# Patient Record
Sex: Male | Born: 1980 | Hispanic: Yes | Marital: Married | Smoking: Current every day smoker
Health system: Southern US, Community
[De-identification: ages and names within clinical notes are randomized; demographics above are authoritative.]

## PROBLEM LIST (undated history)

## (undated) DIAGNOSIS — N200 Calculus of kidney: Secondary | ICD-10-CM

## (undated) HISTORY — PX: FOOT SURGERY: SHX648

---
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
## 2010-08-11 ENCOUNTER — Emergency Department (HOSPITAL_COMMUNITY)
Admission: EM | Admit: 2010-08-11 | Discharge: 2010-08-12 | Disposition: A | Discharge status: Home / Self Care | Payer: Self-pay | Attending: Emergency Medicine | Admitting: Emergency Medicine

## 2010-08-11 ENCOUNTER — Emergency Department (HOSPITAL_COMMUNITY): Payer: Self-pay

## 2010-08-11 DIAGNOSIS — M25529 Pain in unspecified elbow: Secondary | ICD-10-CM | POA: Insufficient documentation

## 2010-08-11 DIAGNOSIS — S52123A Displaced fracture of head of unspecified radius, initial encounter for closed fracture: Secondary | ICD-10-CM | POA: Insufficient documentation

## 2010-08-11 DIAGNOSIS — S92213A Displaced fracture of cuboid bone of unspecified foot, initial encounter for closed fracture: Secondary | ICD-10-CM | POA: Insufficient documentation

## 2010-08-11 DIAGNOSIS — M25429 Effusion, unspecified elbow: Secondary | ICD-10-CM | POA: Insufficient documentation

## 2010-08-11 DIAGNOSIS — M2560 Stiffness of unspecified joint, not elsewhere classified: Secondary | ICD-10-CM | POA: Insufficient documentation

## 2010-08-11 DIAGNOSIS — S9030XA Contusion of unspecified foot, initial encounter: Secondary | ICD-10-CM | POA: Insufficient documentation

## 2010-08-11 DIAGNOSIS — S92253A Displaced fracture of navicular [scaphoid] of unspecified foot, initial encounter for closed fracture: Secondary | ICD-10-CM | POA: Insufficient documentation

## 2010-08-11 DIAGNOSIS — M79609 Pain in unspecified limb: Secondary | ICD-10-CM | POA: Insufficient documentation

## 2010-08-11 DIAGNOSIS — IMO0002 Reserved for concepts with insufficient information to code with codable children: Secondary | ICD-10-CM | POA: Insufficient documentation

## 2010-08-11 DIAGNOSIS — M25539 Pain in unspecified wrist: Secondary | ICD-10-CM | POA: Insufficient documentation

## 2010-08-14 ENCOUNTER — Ambulatory Visit (HOSPITAL_BASED_OUTPATIENT_CLINIC_OR_DEPARTMENT_OTHER)
Admission: RE | Admit: 2010-08-14 | Discharge: 2010-08-14 | Disposition: A | Discharge status: Home / Self Care | Payer: Self-pay | Source: Ambulatory Visit | Attending: Orthopedic Surgery | Admitting: Orthopedic Surgery

## 2010-08-14 DIAGNOSIS — S92253A Displaced fracture of navicular [scaphoid] of unspecified foot, initial encounter for closed fracture: Secondary | ICD-10-CM | POA: Insufficient documentation

## 2010-08-14 DIAGNOSIS — Y998 Other external cause status: Secondary | ICD-10-CM | POA: Insufficient documentation

## 2010-08-14 DIAGNOSIS — IMO0002 Reserved for concepts with insufficient information to code with codable children: Secondary | ICD-10-CM | POA: Insufficient documentation

## 2010-08-14 DIAGNOSIS — S92309A Fracture of unspecified metatarsal bone(s), unspecified foot, initial encounter for closed fracture: Secondary | ICD-10-CM | POA: Insufficient documentation

## 2010-08-14 LAB — POCT HEMOGLOBIN-HEMACUE: Hemoglobin: 14.4 g/dL (ref 13.0–17.0)
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
## 2013-10-19 ENCOUNTER — Emergency Department (HOSPITAL_COMMUNITY)
Admission: EM | Admit: 2013-10-19 | Discharge: 2013-10-19 | Disposition: A | Discharge status: Home / Self Care | Payer: Self-pay | Attending: Emergency Medicine | Admitting: Emergency Medicine

## 2013-10-19 ENCOUNTER — Encounter (HOSPITAL_COMMUNITY): Payer: Self-pay | Admitting: Emergency Medicine

## 2013-10-19 DIAGNOSIS — R1084 Generalized abdominal pain: Secondary | ICD-10-CM | POA: Insufficient documentation

## 2013-10-19 DIAGNOSIS — R6883 Chills (without fever): Secondary | ICD-10-CM | POA: Insufficient documentation

## 2013-10-19 DIAGNOSIS — F17200 Nicotine dependence, unspecified, uncomplicated: Secondary | ICD-10-CM | POA: Insufficient documentation

## 2013-10-19 DIAGNOSIS — R109 Unspecified abdominal pain: Secondary | ICD-10-CM | POA: Insufficient documentation

## 2013-10-19 DIAGNOSIS — R61 Generalized hyperhidrosis: Secondary | ICD-10-CM | POA: Insufficient documentation

## 2013-10-19 DIAGNOSIS — R748 Abnormal levels of other serum enzymes: Secondary | ICD-10-CM | POA: Insufficient documentation

## 2013-10-19 DIAGNOSIS — Z87442 Personal history of urinary calculi: Secondary | ICD-10-CM | POA: Insufficient documentation

## 2013-10-19 HISTORY — DX: Calculus of kidney: N20.0

## 2013-10-19 LAB — COMPREHENSIVE METABOLIC PANEL
ALBUMIN: 4.1 g/dL (ref 3.5–5.2)
ALT: 136 U/L — AB (ref 0–53)
AST: 201 U/L — ABNORMAL HIGH (ref 0–37)
Alkaline Phosphatase: 122 U/L — ABNORMAL HIGH (ref 39–117)
Anion gap: 11 (ref 5–15)
BILIRUBIN TOTAL: 0.3 mg/dL (ref 0.3–1.2)
BUN: 10 mg/dL (ref 6–23)
CO2: 30 meq/L (ref 19–32)
Calcium: 9.4 mg/dL (ref 8.4–10.5)
Chloride: 99 mEq/L (ref 96–112)
Creatinine, Ser: 0.91 mg/dL (ref 0.50–1.35)
GFR calc Af Amer: >90 mL/min (ref >90)
Glucose, Bld: 91 mg/dL (ref 70–99)
POTASSIUM: 4.2 meq/L (ref 3.7–5.3)
SODIUM: 140 meq/L (ref 137–147)
Total Protein: 7.5 g/dL (ref 6.0–8.3)

## 2013-10-19 LAB — URINALYSIS, ROUTINE W REFLEX MICROSCOPIC
BILIRUBIN URINE: NEGATIVE
Glucose, UA: NEGATIVE mg/dL
HGB URINE DIPSTICK: NEGATIVE
KETONES UR: NEGATIVE mg/dL
Leukocytes, UA: NEGATIVE
NITRITE: NEGATIVE
PH: 8 (ref 5.0–8.0)
Protein, ur: NEGATIVE mg/dL
SPECIFIC GRAVITY, URINE: 1.015 (ref 1.005–1.030)
Urobilinogen, UA: 1 mg/dL (ref 0.0–1.0)

## 2013-10-19 LAB — CBC WITH DIFFERENTIAL/PLATELET
Basophils Absolute: 0 10*3/uL (ref 0.0–0.1)
Basophils Relative: 0 % (ref 0–1)
EOS PCT: 2 % (ref 0–5)
Eosinophils Absolute: 0.2 10*3/uL (ref 0.0–0.7)
HCT: 42.2 % (ref 39.0–52.0)
Hemoglobin: 14.5 g/dL (ref 13.0–17.0)
LYMPHS ABS: 2.7 10*3/uL (ref 0.7–4.0)
Lymphocytes Relative: 23 % (ref 12–46)
MCH: 29.8 pg (ref 26.0–34.0)
MCHC: 34.4 g/dL (ref 30.0–36.0)
MCV: 86.7 fL (ref 78.0–100.0)
MONO ABS: 0.6 10*3/uL (ref 0.1–1.0)
Monocytes Relative: 5 % (ref 3–12)
Neutro Abs: 8.4 10*3/uL — ABNORMAL HIGH (ref 1.7–7.7)
Neutrophils Relative %: 70 % (ref 43–77)
Platelets: 178 10*3/uL (ref 150–400)
RBC: 4.87 MIL/uL (ref 4.22–5.81)
RDW: 12.3 % (ref 11.5–15.5)
WBC: 11.9 10*3/uL — ABNORMAL HIGH (ref 4.0–10.5)

## 2013-10-19 LAB — HEPATITIS PANEL, ACUTE
HCV AB: NEGATIVE
HEP A IGM: NONREACTIVE
HEP B C IGM: NONREACTIVE
Hepatitis B Surface Ag: NEGATIVE

## 2013-10-19 LAB — LIPASE, BLOOD: Lipase: 72 U/L — ABNORMAL HIGH (ref 11–59)

## 2013-10-19 MED ORDER — GI COCKTAIL ~~LOC~~: 30.0000 mL | Freq: Once | ORAL | Status: DC

## 2013-10-19 MED ORDER — KETOROLAC TROMETHAMINE 30 MG/ML IJ SOLN
30.0000 mg | Freq: Once | INTRAMUSCULAR | Status: AC
  Administered 2013-10-19: 30 mg via INTRAVENOUS
  Filled 2013-10-19: qty 1

## 2013-10-19 NOTE — ED Notes (Signed)
Per EMS, Patient from home, had a slightly greasy meal at dinner which he reports often causes him abd discomfort. Patient states he laid down after eating and began having upper abd pain described as cramping 10/10. Patient denies n/v/d, states he has never had this pain before. Patient denies bowl difficulties, no distension, no tenderness, no injury reported. Patient denies drug or etoh use.

## 2013-10-19 NOTE — ED Provider Notes (Signed)
Medical screening examination/treatment/procedure(s) were performed by non-physician practitioner and as supervising physician I was immediately available for consultation/collaboration.    Vida Roller, MD 10/19/13 [phone]

## 2013-10-19 NOTE — ED Notes (Signed)
Patient states he is here for abd pain, states his stomach "started shrinking on the inside" and that he began sweating. Patient states he attempted to make himself vomit but was unable to do so. Patient alert and oriented at this time. Denies alcohol or drug use.

## 2013-10-19 NOTE — Discharge Instructions (Signed)
Please follow the directions provided.  Your blood work for your liver were elevated and will need follow-up with a primary care provider.  Labs were ordered to check for hepatitis but they will not be back for several days.  Please avoid taking anything with Tylenol (acetaminophen) in it. Also avoid alcohol.  Please be sure to establish care with a primary care provider this week.   Abdominal (belly) pain can be caused by many things. Your caregiver performed an examination and possibly ordered blood/urine tests and imaging (CT scan, x-rays, ultrasound). Many cases can be observed and treated at home after initial evaluation in the emergency department. Even though you are being discharged home, abdominal pain can be unpredictable. Therefore, you need a repeated exam if your pain does not resolve, returns, or worsens. Most patients with abdominal pain don't have to be admitted to the hospital or have surgery, but serious problems like appendicitis and gallbladder attacks can start out as nonspecific pain. Many abdominal conditions cannot be diagnosed in one visit, so follow-up evaluations are very important.  SEEK IMMEDIATE MEDICAL ATTENTION IF: The pain does not go away or becomes severe.  A temperature above 101 develops.  Repeated vomiting occurs (multiple episodes).  The pain becomes localized to portions of the abdomen. The right side could possibly be appendicitis. In an adult, the left lower portion of the abdomen could be colitis or diverticulitis.  Blood is being passed in stools or vomit (bright red or black tarry stools).  Return also if you develop chest pain, difficulty breathing, dizziness or fainting, or become confused, poorly responsive,.  Emergency Department Resource Guide 1) Find a Doctor and Pay Out of Pocket Although you won't have to find out who is covered by your insurance plan, it is a good idea to ask around and get recommendations. You will then need to call the office and  see if the doctor you have chosen will accept you as a new patient and what types of options they offer for patients who are self-pay. Some doctors offer discounts or will set up payment plans for their patients who do not have insurance, but you will need to ask so you aren't surprised when you get to your appointment.  2) Contact Your Local Health Department Not all health departments have doctors that can see patients for sick visits, but many do, so it is worth a call to see if yours does. If you don't know where your local health department is, you can check in your phone book. The CDC also has a tool to help you locate your state's health department, and many state websites also have listings of all of their local health departments.  3) Find a Walk-in Clinic If your illness is not likely to be very severe or complicated, you may want to try a walk in clinic. These are popping up all over the country in pharmacies, drugstores, and shopping centers. They're usually staffed by nurse practitioners or physician assistants that have been trained to treat common illnesses and complaints. They're usually fairly quick and inexpensive. However, if you have serious medical issues or chronic medical problems, these are probably not your best option.  No Primary Care Doctor: - Call [facility] at  ([phone] - they can help you locate a primary care doctor that  accepts your insurance, provides certain services, etc. - Physician Referral Service- [phone]  Chronic Pain Problems: Organization         Address  Phone  [facility]  ([phone]) [phone] Patients need to be referred by their primary care doctor.  ° °Medication Assistance: °Organization         Address  Phone   Notes  °[place] [facility] [address] °[place], [place] [address] (336) 641-8030 --Must be a resident of [place] °-- Must have NO insurance coverage whatsoever (no  Medicaid/ Medicare, etc.) °-- The pt. MUST have a primary care doctor that directs their care regularly and follows them in the community °  °[facility]  (866) 331-1348   °[employer]  (888) 892-1162   ° °Agencies that provide inexpensive medical care: °Organization         Address  Phone   Notes  °[facility]  (336) 832-8035   °[facility]    (336) 832-7272   °[facility] [address] °[place], [place] [address] (336) 832-4777   °[facility] [address], °[place] (336) 271-4999   °[facility]    (336) 373-0678   °[facility]    (336) 272-1050   °[facility] ° [address], [place] Phone:  (336) 832-4444, Fax:  (336) 832-4440 Hours of Operation:  9 am - 6 pm, M-F.  Also accepts Medicaid/Medicare and self-pay.  ° [facility] ° [address], [place] Phone: (336) [phone], Fax: (336) [phone]. Hours of Operation:  8:30 am - 5:30 pm, M-F.  Also accepts Medicaid and self-pay.  °[facility] [place] [address], [place] Phone: (336) 878-6027   °[facility] [address], [place], [place] (336)723-1848, Ext. 123 Mondays & Thursdays: 7-9 AM.  First 15 patients are seen on a first come, first serve basis. °  ° °Medicaid-accepting [place] Providers: ° °Organization         Address  Phone   Notes  °[facility] [address], [place] (336) [phone] Also accepts self-pay patients.  °[facility] [address], [place] ° (336) [phone]   °[facility] [address], [place] (336) 288-8857   °[facility] [address] [place] [address], [place] (336) 299-7000   °Veita Bland [address], [place]  ° (336) [phone] Only accepts [place] Access Medicaid patients after they have their name applied to their card.   ° °Self-Pay (no insurance) in [place]: ° °Organization         Address  Phone   Notes  °Sickle Cell Patients, [facility] [address], [place] (336) 832-1970   °[facility] [address], [place] (336) 832-4400   °[facility] [place] ° [address], [place] (336) 992-4800   °[facility]/Dr. Osei-Bonsu ° [address] [place] [address], [place] or [address], [place] (336) [phone] Phone number for both [place] and [place] locations is the same.  °[facility] [address], [place] (336) 299-0000   °[facility] [address] [place] [address], [place] or [address] (336) 852-7530 °(336) 878-2260   °[facility] [address], [place] (336) 350-1642, phone; (336) 294-5005, fax Sees patients 1st and 3rd Saturday of every month.  Must not qualify for public or private insurance (i.e. Medicaid,   Medicare, [place] Health Choice, Veterans' Benefits) • Household income should be no more than 200% of the poverty level •The clinic cannot treat you if you are pregnant or think you are pregnant • Sexually transmitted diseases are not treated at the clinic.  ° ° °Dental Care: °Organization         Address  Phone  Notes  °[facility] [address], [place] (336) 641-6152 Accepts children up to age 21 who are enrolled in Medicaid or [place] Health Choice; pregnant women with a Medicaid card; and children who have applied for Medicaid or [place] Health Choice, but were declined, whose parents can pay a reduced fee at time of service.  °[facility] [place]  [address], [place] (336) 641-7733 Accepts children up to age 21 who are enrolled in Medicaid or [place] Health Choice; pregnant women with a Medicaid card; and children who have applied for Medicaid or [place] Health Choice,  but were declined, whose parents can pay a reduced fee at time of service.  °[facility] ° [address], [place] (336) 641-4533 Patients are seen by appointment only. Walk-ins are not accepted. [facility] will see patients 18 years of age and older. °Monday - Tuesday (8am-5pm) °Most Wednesdays (8:30-5pm) °$30 per visit, cash only  °[facility] ° [address], [place] (336) 641-4533 Patients are seen by appointment only. Walk-ins are not accepted. [facility] will see patients 18 years of age and older. °One Wednesday Evening (Monthly: Volunteer Based).  $30 per visit, cash only  °[facility]  (919) 537-3737 for adults; Children under age 4, call Graduate Pediatric Dentistry at ([phone]) [phone]. Children aged 4-14, please call ([phone]) [phone] to request a pediatric application. ° Dental services are provided in all areas of dental care including fillings, crowns and bridges, complete and partial dentures, implants, gum treatment, root canals, and extractions. Preventive care is also provided. Treatment is provided to both adults and children. °Patients are selected via a lottery and there is often a waiting list. °  °[facility] [address], °[place] ° (336) 763-8833 [email or web address] °  °[facility] [address], [place], [place] (336)723-1848, Ext. 123 Second and Fourth Thursday of each month, opens at 6:30 AM; Clinic ends at 9 AM.  Patients are seen on a first-come first-served basis, and a limited number are seen during each clinic.  ° °[facility] ° [address], [place], [place] (336) 723-7904   Eligibility Requirements °You must have lived in [place], [place], or [place] counties for at least the last three months. °  You cannot be eligible for state or federal sponsored healthcare insurance, including Veterans Administration, Medicaid, or Medicare. °  You generally  cannot be eligible for healthcare insurance through your employer.  °  How to apply: °Eligibility screenings are held every Tuesday and Wednesday afternoon from 1:00 pm until 4:00 pm. You do not need an appointment for the interview!  °[facility] [address], [place], [place] 336-631-2330   °[facility]  336-342-8273   °[facility]  336-703-3100   °[facility]  336-570-6415   ° °Behavioral Health Resources in the Community: °Intensive Outpatient Programs °Organization         Address  Phone  Notes  °[facility] [address], [place], [place] 336-878-6098   °[facility] [address], [place], [place] 336-832-9800   °[facility] [address], [place], [place] ° 336-882-2125   °[facility] [address],  °[place], [place] 1-800-853-5163 or 336-641-4981   °Substance Abuse Resources °Organization         Address  Phone  Notes  °[facility]  336-882-2125   °[facility]  336-784-9470   °[facility]  336-285-9073   °[facility]  336-845-3988   °Residential & Outpatient Substance Abuse Program  1-800-659-3381   °Psychological Services °Organization         Address  Phone  Notes  °[facility]  336- 832-9600   °[facility]  336- 378-7881   °[facility] [address], [place] 1-800-853-5163 or 336-641-4981   ° °Mobile Crisis Teams °Organization         Address  Phone  Notes  °[facility], Mobile Crisis Care Unit  1-877-626-1772   °Assertive °[facility] ° [address] [place], [place] 336-834-9664   °Sharon DeEsch [address] °[place] 336-554-5454   ° °Self-Help/Support Groups °Organization         Address  Phone             Notes  °[facility] - variety of support groups  336- 373-1402 Call for more  information  °Narcotics Anonymous (NA), [facility] [address], °[place]  2 meetings at this location  ° °Residential Treatment Programs °Organization         Address  Phone  Notes  °[facility] [address],    °[place]  1-866-801-8205   °[facility] ° [address], [place], [place] 704-293-8524   °[facility] [address], [place] 336-845-3988 Admissions: 8am-3pm M-F  °[facility] [address]    °[place], [place] 336-841-1104   °[facility] [address], [place], [place] 336-379-7146   °[facility] [address]  °[place], [place] 336-285-9073   °[facility] - Intensive Outpatient [address], [place], [place] 336-852-3033   °[facility] [address]  °[place], [place] 1-877-615-2722 or 336-784-9470   °[facility] ([facility] [address] [place], [place] 336-227-7417 Accepts Medicaid  °[facility] [address]  ° [place] 1-800-659-3381 Substance Abuse/Addiction Treatment  ° °[place] Behavioral Health Resources °Organization         Address  Phone  Notes  °[facility]  (888) 581-9988   °Julie Brannon, PhD [address] [place], [place]   (336) 349-5553 or (336) 951-0000   °[facility]   [address] °[place], [place] (336) 349-4454   °[facility] [address], [place], [place] (336) 342-8316 Insurance/Medicaid/sponsorship through [facility]  °[facility] [address]                                    [place], [place] (336) 342-8316 Therapy/tele-psych/case  °[facility] [address]  ° [place],  (336) 349-2233    °Dr. Arfeen  (336) 349-4544   °[facility] [place]  [employer] [place] [facility] 1) [address], [place] °2) [address], [place] °3)  [address], [place] (336)  349-3220 °(336) 342-7768 ° °(336) 342-8140   °[place] Child Abuse [facility] (336) 342-1394 or (336) 342-3537 (After Hours)    ° ° ° °

## 2013-10-19 NOTE — ED Provider Notes (Signed)
CSN: [id number]     Arrival date & time 10/19/13  0025 History   First MD Initiated Contact with Patient 10/19/13 0133     Chief Complaint  Patient presents with  . Abdominal Pain    upper   (Consider location/radiation/quality/duration/timing/severity/associated sxs/prior Treatment) HPI Chris Kelley is a 33 yo male PMH: gall stones and cholecystectomy, presenting to the ED with sudden onset abd pain this evening.  He states after he finished eating tonight, he felt like his abdomen "was shrinking" because it was cramping.  He states the pain made him short of breath and sweaty.  This resolved in the ambulance en route to the hospital.  His pain now is now resolved but it intermittently hurts.  He denies fever, chills, nausea, vomiting, diarrhea or constipation, dysuria or penile or testicular complaints.    Past Medical History  Diagnosis Date  . Kidney stones    History reviewed. No pertinent past surgical history. No family history on file. History  Substance Use Topics  . Smoking status: Current Every Day Smoker -- 0.50 packs/day    Types: Cigarettes  . Smokeless tobacco: Not on file  . Alcohol Use: No    Review of Systems  Constitutional: Positive for chills and diaphoresis. Negative for fever.  HENT: Negative for sore throat.   Eyes: Negative for visual disturbance.  Respiratory: Negative for cough and shortness of breath.   Cardiovascular: Negative for chest pain and leg swelling.  Gastrointestinal: Positive for abdominal pain. Negative for nausea, vomiting, diarrhea, constipation, blood in stool, abdominal distention and anal bleeding.  Genitourinary: Negative for dysuria.  Musculoskeletal: Negative for myalgias.  Skin: Negative for rash.  Neurological: Negative for weakness, numbness and headaches.    Allergies  Review of patient's allergies indicates no known allergies.  Home Medications   Prior to Admission medications   Medication Sig Start Date End Date  Taking? Authorizing Provider  acetaminophen (TYLENOL) 500 MG tablet Take 1,500 mg by mouth every 6 (six) hours as needed for mild pain.   Yes Historical Provider, MD  ibuprofen (ADVIL,MOTRIN) 200 MG tablet Take 1,000 mg by mouth every 6 (six) hours as needed for moderate pain.   Yes Historical Provider, MD   BP 147/92  Pulse 68  Temp(Src) 97.5 F (36.4 C) (Oral)  Resp 20  SpO2 99% Physical Exam  Nursing note and vitals reviewed. Constitutional: He is oriented to person, place, and time. He appears well-developed and well-nourished. No distress.  HENT:  Head: Normocephalic and atraumatic.  Mouth/Throat: Oropharynx is clear and moist. No oropharyngeal exudate.  Eyes: Conjunctivae are normal. No scleral icterus.  Neck: Neck supple. No thyromegaly present.  Cardiovascular: Normal rate, regular rhythm and intact distal pulses.   Pulmonary/Chest: Effort normal and breath sounds normal. No respiratory distress. He has no wheezes. He has no rales. He exhibits no tenderness.  Abdominal: Soft. Normal appearance and bowel sounds are normal. He exhibits no distension and no mass. There is no hepatosplenomegaly. There is tenderness. There is no rigidity, no rebound, no guarding, no CVA tenderness and no tenderness at McBurney's point.  Musculoskeletal: He exhibits no tenderness.  Lymphadenopathy:    He has no cervical adenopathy.  Neurological: He is alert and oriented to person, place, and time.  Skin: Skin is warm and dry. No rash noted. He is not diaphoretic.  Psychiatric: He has a normal mood and affect.    ED Course  Procedures (including critical care time) Labs Review Labs Reviewed  COMPREHENSIVE METABOLIC PANEL -  Abnormal; Notable for the following:    AST 201 (*)    ALT 136 (*)    Alkaline Phosphatase 122 (*)    All other components within normal limits  CBC WITH DIFFERENTIAL - Abnormal; Notable for the following:    WBC 11.9 (*)    Neutro Abs 8.4 (*)    All other components  within normal limits  LIPASE, BLOOD - Abnormal; Notable for the following:    Lipase 72 (*)    All other components within normal limits  URINALYSIS, ROUTINE W REFLEX MICROSCOPIC - Abnormal; Notable for the following:    APPearance CLOUDY (*)    All other components within normal limits  HEPATITIS PANEL, ACUTE   Imaging Review No results found.   EKG Interpretation None      MDM   Final diagnoses:  Abdominal pain, generalized  Elevated liver enzymes   Pt is well appearing, does not appear to be in any distress, he is sleeping during the exam but will awaken easily.  CBC, CMP, Lipase and UA collected. The AST, ALT, and Alk Phos were elevated to 201, 136, and 122 respectively.  He Denies excessive drinking and reports he only drinks one beer occasionally.  He only denies excessive acetaminophen use.  His abd tenderness on exam is vague and unable to reproduce but he is not tender to the RUQ.  He does not have any signs of peritonitis, or indication of  indication of appendicitis, bowel obstruction, bowel perforation, cholecystitis, (s/p cholecystectomy) or diverticulitis.  He was given IV toradol in the ED and his pain is improved.  Discharge instructions include resources to establish and follow-up with a primary care provider and instructions about the importance of following-up this week.  Return precautions include fever, chills, severe pain or intractable nausea and vomiting.   Pt and family are agreeable to the plan.   Filed Vitals:   10/19/13 0423  BP: 125/80  Pulse: 71  Temp: 97.8 F (36.6 C)  Resp: 16    Meds given in ED:  Medications  ketorolac (TORADOL) 30 MG/ML injection 30 mg (30 mg Intravenous Given 10/19/13 0307)    Discharge Medication List as of 10/19/2013  4:08 AM       Britt Bottom, NP 10/19/13 3403

## 2019-07-08 ENCOUNTER — Other Ambulatory Visit: Payer: Self-pay

## 2019-07-08 ENCOUNTER — Emergency Department (HOSPITAL_COMMUNITY): Payer: Self-pay

## 2019-07-08 ENCOUNTER — Emergency Department (HOSPITAL_COMMUNITY)
Admission: EM | Admit: 2019-07-08 | Discharge: 2019-07-08 | Disposition: A | Discharge status: Home / Self Care | Payer: Self-pay | Attending: Emergency Medicine | Admitting: Emergency Medicine

## 2019-07-08 ENCOUNTER — Encounter (HOSPITAL_COMMUNITY): Payer: Self-pay | Admitting: *Deleted

## 2019-07-08 DIAGNOSIS — S62314A Displaced fracture of base of fourth metacarpal bone, right hand, initial encounter for closed fracture: Secondary | ICD-10-CM | POA: Insufficient documentation

## 2019-07-08 DIAGNOSIS — Y929 Unspecified place or not applicable: Secondary | ICD-10-CM | POA: Insufficient documentation

## 2019-07-08 DIAGNOSIS — Y999 Unspecified external cause status: Secondary | ICD-10-CM | POA: Insufficient documentation

## 2019-07-08 DIAGNOSIS — W11XXXA Fall on and from ladder, initial encounter: Secondary | ICD-10-CM | POA: Insufficient documentation

## 2019-07-08 DIAGNOSIS — Y939 Activity, unspecified: Secondary | ICD-10-CM | POA: Insufficient documentation

## 2019-07-08 DIAGNOSIS — W19XXXA Unspecified fall, initial encounter: Secondary | ICD-10-CM

## 2019-07-08 DIAGNOSIS — F17210 Nicotine dependence, cigarettes, uncomplicated: Secondary | ICD-10-CM | POA: Insufficient documentation

## 2019-07-08 MED ORDER — NAPROXEN 250 MG PO TABS
500.0000 mg | ORAL_TABLET | Freq: Once | ORAL | Status: AC
  Administered 2019-07-08: 500 mg via ORAL
  Filled 2019-07-08: qty 2

## 2019-07-08 MED ORDER — NAPROXEN 500 MG PO TABS: 500.0000 mg | ORAL_TABLET | Freq: Two times a day (BID) | ORAL | 0 refills | Status: AC

## 2019-07-08 NOTE — Discharge Instructions (Signed)
Le he recetado medicina para Chief Technology Officer, tome una [address] veces al dia con comida.   Haga una cita con el cirguano de mano para evaluar su fractura de [place].

## 2019-07-08 NOTE — ED Notes (Signed)
Patient verbalizes understanding of discharge instructions. Opportunity for questioning and answers were provided. Armband removed by staff, pt discharged from ED and ambulated to lobby to leave with friend.

## 2019-07-08 NOTE — Progress Notes (Signed)
Orthopedic Tech Progress Note Patient Details:  Chris Kelley 11-May-1980 [id number]  Ortho Devices Type of Ortho Device: Volar splint, Finger splint Ortho Device/Splint Interventions: Application, Ordered   Post Interventions Patient Tolerated: Well   Gwendolyn Lima 07/08/2019, 4:28 PM

## 2019-07-08 NOTE — ED Provider Notes (Signed)
[facility] EMERGENCY DEPARTMENT Provider Note   CSN: [id number] Arrival date & time: 07/08/19  1342     History Chief Complaint  Patient presents with  . Fall    Chris Kelley is a 39 y.o. male.  39 y.o male with no PMH presents to the ED with a chief complaint of right hand pain s/p fall. Patient reports he was standing in the middle of a ladder when he fell bracing the fall with an outstretched hand. He ports pain along the dorsum aspect of his hand.  There is mild swelling noted to the dorsum aspect, swelling to the right index finger with limited range of motion.  Full range of motion of his right wrist.  He has not taken any medication for improvement in symptoms.  Denies any other injury.  The history is provided by the patient and medical records.  Fall Pertinent negatives include no chest pain, no abdominal pain, no headaches and no shortness of breath.       Past Medical History:  Diagnosis Date  . Kidney stones     There are no problems to display for this patient.   Past Surgical History:  Procedure Laterality Date  . FOOT SURGERY         No family history on file.  Social History   Tobacco Use  . Smoking status: Current Every Day Smoker    Packs/day: 0.50    Types: Cigarettes  . Smokeless tobacco: Never Used  Substance Use Topics  . Alcohol use: No  . Drug use: Never    Home Medications Prior to Admission medications   Medication Sig Start Date End Date Taking? Authorizing Provider  acetaminophen (TYLENOL) 500 MG tablet Take 1,500 mg by mouth every 6 (six) hours as needed for mild pain.    [provider]  ibuprofen (ADVIL,MOTRIN) 200 MG tablet Take 1,000 mg by mouth every 6 (six) hours as needed for moderate pain.    [provider]  naproxen (NAPROSYN) 500 MG tablet Take 1 tablet (500 mg total) by mouth 2 (two) times daily for 7 days. 07/08/19 07/15/19  Claude Manges, PA-C    Allergies    Patient has  no known allergies.  Review of Systems   Review of Systems  Constitutional: Negative for fever.  HENT: Negative for sore throat.   Respiratory: Negative for shortness of breath.   Cardiovascular: Negative for chest pain.  Gastrointestinal: Negative for abdominal pain, nausea and vomiting.  Genitourinary: Negative for flank pain.  Musculoskeletal: Positive for arthralgias and myalgias.  Neurological: Negative for light-headedness and headaches.  All other systems reviewed and are negative.   Physical Exam Updated Vital Signs BP (!) 142/87   Pulse 86   Temp 98.3 F (36.8 C) (Oral)   Resp 14   Ht 5\' 4"  (1.626 m)   Wt 63.5 kg   SpO2 98%   BMI 24.03 kg/m   Physical Exam Vitals and nursing note reviewed.  Constitutional:      Appearance: Normal appearance. He is not ill-appearing or toxic-appearing.  HENT:     Head: Normocephalic and atraumatic.     Comments: Visible lacerations, or bruising noted.    Nose: Nose normal.     Mouth/Throat:     Mouth: Mucous membranes are moist.  Eyes:     Pupils: Pupils are equal, round, and reactive to light.  Cardiovascular:     Rate and Rhythm: Normal rate.  Pulmonary:     Effort: Pulmonary  effort is normal.     Breath sounds: No wheezing or rales.  Abdominal:     General: Abdomen is flat.  Musculoskeletal:     Right wrist: No swelling, deformity, effusion, lacerations, tenderness, bony tenderness, snuff box tenderness or crepitus. Normal range of motion. Normal pulse.     Right hand: Swelling, deformity, tenderness and bony tenderness present. No lacerations. Decreased range of motion. Normal strength. Normal sensation. There is no disruption of two-point discrimination. Normal capillary refill. Normal pulse.     Cervical back: Normal range of motion and neck supple.     Comments: Pain with palpation of the dorsum aspect of the right hand along 4th metacarpal, swelling noted to the area with mild erythema.  Swelling also noted to the  right ring finger.  Limited range of motion of the right finger.  Capillary refill is intact, sensation preserved.  Skin:    General: Skin is warm and dry.  Neurological:     Mental Status: He is alert and oriented to person, place, and time.     ED Results / Procedures / Treatments   Labs (all labs ordered are listed, but only abnormal results are displayed) Labs Reviewed - No data to display  EKG None  Radiology DG Hand Complete Right  Result Date: 07/08/2019 CLINICAL DATA:  Patient fell down 3 stairs landing on the right hand. Complaining of pain. EXAM: RIGHT HAND - COMPLETE 3+ VIEW COMPARISON:  Wrist radiographs, 08/11/2010 FINDINGS: There is an oblique, non comminuted, nondisplaced fracture across the base of the third metacarpal. There is a probable additional fracture across the base of the fourth metacarpal, only evident on the oblique view. Minimally displaced dorsal avulsion fracture from the dorsal base of the distal phalanx of the ring finger, retracted proximally by 2 mm. Well corticated bone fragment lies dorsal to the carpal bones consistent with an old triquetral fracture. Mild soft tissue swelling that is most evident around the DIP joint of the fourth finger. IMPRESSION: 1. Oblique nondisplaced, non comminuted fracture across the base of the third metacarpal with a probable additional transverse fracture across the base of the fourth metacarpal. 2. Minimally displaced dorsal avulsion fracture from the dorsal base of the distal phalanx of the fourth finger. 3. No other fractures.  No dislocation Electronically Signed   By: Lajean Manes M.D.   On: 07/08/2019 15:01    Procedures Procedures (including critical care time)  Medications Ordered in ED Medications  naproxen (NAPROSYN) tablet 500 mg (500 mg Oral Given 07/08/19 1529)    ED Course  I have reviewed the triage vital signs and the nursing notes.  Pertinent labs & imaging results that were available during my care  of the patient were reviewed by me and considered in my medical decision making (see chart for details).    MDM Rules/Calculators/A&P  To the ED status fall from a ladder, reports about 5 feet, states he try to brace his fall with his right hand, does report pain to the dorsum aspect of his right hand along with right index finger, states he is able to flex his hand but with pain.  There is swelling noted to the hand aspect, full range of motion of the right wrist.  Has not taken any medication for pain control.  During evaluation pulses are present, sensation is intact, visible swelling to the dorsum aspect along the fourth metacarpal.  There is also swelling to the distal aspect of the fourth digit.  No pain with  palpation of the snuffbox, no pain with palpation of the wrist joint.   Xray of the right hand showed: 1. Oblique nondisplaced, non comminuted fracture across the base of  the third metacarpal with a probable additional transverse fracture  across the base of the fourth metacarpal.  2. Minimally displaced dorsal avulsion fracture from the dorsal base  of the distal phalanx of the fourth finger.  3. No other fractures. No dislocation     Consult placed to hand on call for further recommendations. Patient provided with naproxen for pain control.   3:48 PM Spoke to Dr. Orlan Leavens, who agrees with therapy and patient follow-up.  Personally placed patient on anti-inflammatories, he was provided with a copy of his x-ray along with a volar splint.  Return precautions were discussed at length, patient stable for discharge.   Portions of this note were generated with Scientist, clinical (histocompatibility and immunogenetics). Dictation errors may occur despite best attempts at proofreading.  Final Clinical Impression(s) / ED Diagnoses Final diagnoses:  Fall, initial encounter  Closed displaced fracture of base of fourth metacarpal bone of right hand, initial encounter    Rx / DC Orders ED Discharge Orders          Ordered    naproxen (NAPROSYN) 500 MG tablet  2 times daily     07/08/19 1602           Claude Manges, PA-C 07/08/19 1603    Alvira Monday, MD 07/09/19 1507

## 2019-07-08 NOTE — ED Triage Notes (Signed)
Patient states he fell down 3 stairs landing on his right hand. C/o pain in right hand , denies any other injuries. All information obtained through interpreter line.

## 2019-07-08 NOTE — ED Notes (Addendum)
Ortho Tech William called for arm and finger splint.

## 2020-02-07 ENCOUNTER — Emergency Department (HOSPITAL_COMMUNITY): Payer: Self-pay

## 2020-02-07 ENCOUNTER — Encounter (HOSPITAL_COMMUNITY): Payer: Self-pay

## 2020-02-07 ENCOUNTER — Emergency Department (HOSPITAL_COMMUNITY)
Admission: EM | Admit: 2020-02-07 | Discharge: 2020-02-08 | Disposition: A | Discharge status: Home / Self Care | Payer: Self-pay | Attending: Emergency Medicine | Admitting: Emergency Medicine

## 2020-02-07 ENCOUNTER — Other Ambulatory Visit: Payer: Self-pay

## 2020-02-07 ENCOUNTER — Ambulatory Visit (HOSPITAL_COMMUNITY)
Admission: EM | Admit: 2020-02-07 | Discharge: 2020-02-07 | Disposition: A | Discharge status: Home / Self Care | Payer: Self-pay | Attending: Family Medicine | Admitting: Family Medicine

## 2020-02-07 DIAGNOSIS — N50811 Right testicular pain: Secondary | ICD-10-CM | POA: Insufficient documentation

## 2020-02-07 DIAGNOSIS — N4400 Torsion of testis, unspecified: Secondary | ICD-10-CM

## 2020-02-07 DIAGNOSIS — Z5321 Procedure and treatment not carried out due to patient leaving prior to being seen by health care provider: Secondary | ICD-10-CM | POA: Insufficient documentation

## 2020-02-07 MED ORDER — OXYCODONE-ACETAMINOPHEN 5-325 MG PO TABS
1.0000 | ORAL_TABLET | ORAL | Status: DC | PRN
  Administered 2020-02-07: 1 via ORAL
  Filled 2020-02-07: qty 1

## 2020-02-07 NOTE — ED Triage Notes (Signed)
Pt reports that he was lifting something heavy yesterday and began to have sudden onset of pain and swelling in the R testicle, seen at [facility] and told to come here

## 2020-02-07 NOTE — ED Notes (Signed)
Pt states he had a fever hours ago before coming to UC.

## 2020-02-07 NOTE — ED Notes (Signed)
Patient is being discharged from the Urgent Care and sent to the Emergency Department via private vehicle . Per dr Jordan Likes , patient is in need of higher level of care due to testicular emergency. Patient is aware and verbalizes understanding of plan of care.  Vitals:   02/07/20 1908  BP: 134/85  Pulse: 99  Resp: 16  Temp: 98.6 F (37 C)  SpO2: 100%

## 2020-02-07 NOTE — Discharge Instructions (Signed)
Please be seen in the emergency department.  

## 2020-02-07 NOTE — ED Triage Notes (Signed)
Pt c/o of testicle pain x 2 days. Pt states the right side testicle is swollen and is experiencing lower back pain. Pt states he has not taken medication to relieve the pain. Pt states he has been able to urinate. Pt states he had kidney stones before. Pt states he had blood in urine 3 days ago. He states he is having right lower abdominal pain after lifting a heavy object.

## 2020-02-07 NOTE — ED Provider Notes (Signed)
[facility]    CSN: [id number] Arrival date & time: 02/07/20  1733      History   Chief Complaint Chief Complaint  Patient presents with  . Testicle Pain  . Back Pain    HPI Chris Kelley is a 39 y.o. male.   He is presenting with acutely painful right swollen testicle.  His symptoms are gotten worse over the past day.  He has pain with palpation as well as going to the bathroom.  No history of similar pain.  He did lift something heavy.  HPI  Past Medical History:  Diagnosis Date  . Kidney stones     There are no problems to display for this patient.   Past Surgical History:  Procedure Laterality Date  . FOOT SURGERY         Home Medications    Prior to Admission medications   Medication Sig Start Date End Date Taking? Authorizing Provider  acetaminophen (TYLENOL) 500 MG tablet Take 1,500 mg by mouth every 6 (six) hours as needed for mild pain.    [provider]  ibuprofen (ADVIL,MOTRIN) 200 MG tablet Take 1,000 mg by mouth every 6 (six) hours as needed for moderate pain.    [provider]    Family History History reviewed. No pertinent family history.  Social History Social History   Tobacco Use  . Smoking status: Current Every Day Smoker    Packs/day: 0.50    Types: Cigarettes  . Smokeless tobacco: Never Used  Substance Use Topics  . Alcohol use: No  . Drug use: Never     Allergies   Patient has no known allergies.   Review of Systems Review of Systems  See HPI  Physical Exam Triage Vital Signs ED Triage Vitals  Enc Vitals Group     BP 02/07/20 1908 134/85     Pulse Rate 02/07/20 1908 99     Resp 02/07/20 1908 16     Temp 02/07/20 1908 98.6 F (37 C)     Temp Source 02/07/20 1908 Oral     SpO2 02/07/20 1908 100 %     Weight --      Height --      Head Circumference --      Peak Flow --      Pain Score 02/07/20 1905 10     Pain Loc --      Pain Edu? --      Excl. in GC? --    No data  found.  Updated Vital Signs BP 134/85 (BP Location: Right Arm)   Pulse 99   Temp 98.6 F (37 C) (Oral)   Resp 16   SpO2 100%   Visual Acuity Right Eye Distance:   Left Eye Distance:   Bilateral Distance:    Right Eye Near:   Left Eye Near:    Bilateral Near:     Physical Exam Gen: NAD, alert, cooperative with exam,  ENT: normal lips, normal nasal mucosa,  Eye: normal EOM, normal conjunctiva and lids GU: Significantly swollen and red and tender right testicle Skin: no rashes, no areas of induration  Neuro: normal tone, normal sensation to touch Psych:  normal insight, alert and oriented    UC Treatments / Results  Labs (all labs ordered are listed, but only abnormal results are displayed) Labs Reviewed - No data to display  EKG   Radiology No results found.  Procedures Procedures (including critical care time)  Medications Ordered in UC  Medications - No data to display  Initial Impression / Assessment and Plan / UC Course  I have reviewed the triage vital signs and the nursing notes.  Pertinent labs & imaging results that were available during my care of the patient were reviewed by me and considered in my medical decision making (see chart for details).     Mr. Chris Kelley is a 39 year old male that is presenting with a right painful swollen testicle.  Symptoms been ongoing for about a day.  They have progressively gotten worse.  Significant tenderness on exam.  Concern for testicular torsion.  Advised to be seen in the emergency department.  Final Clinical Impressions(s) / UC Diagnoses   Final diagnoses:  Testicular torsion     Discharge Instructions     Please be seen in the emergency department.     ED Prescriptions    None     PDMP not reviewed this encounter.   Myra Rude, MD 02/07/20 [phone]

## 2020-02-08 ENCOUNTER — Emergency Department (HOSPITAL_COMMUNITY)
Admission: EM | Admit: 2020-02-08 | Discharge: 2020-02-09 | Disposition: A | Discharge status: Home / Self Care | Payer: Self-pay | Attending: Emergency Medicine | Admitting: Emergency Medicine

## 2020-02-08 ENCOUNTER — Emergency Department (HOSPITAL_COMMUNITY): Payer: Self-pay

## 2020-02-08 DIAGNOSIS — N451 Epididymitis: Secondary | ICD-10-CM | POA: Insufficient documentation

## 2020-02-08 DIAGNOSIS — N4400 Torsion of testis, unspecified: Secondary | ICD-10-CM

## 2020-02-08 DIAGNOSIS — F17210 Nicotine dependence, cigarettes, uncomplicated: Secondary | ICD-10-CM | POA: Insufficient documentation

## 2020-02-08 LAB — CBC WITH DIFFERENTIAL/PLATELET
Abs Immature Granulocytes: 0.08 10*3/uL — ABNORMAL HIGH (ref 0.00–0.07)
Basophils Absolute: 0.1 10*3/uL (ref 0.0–0.1)
Basophils Relative: 0 %
Eosinophils Absolute: 0.1 10*3/uL (ref 0.0–0.5)
Eosinophils Relative: 1 %
HCT: 43.2 % (ref 39.0–52.0)
Hemoglobin: 13.8 g/dL (ref 13.0–17.0)
Immature Granulocytes: 1 %
Lymphocytes Relative: 14 %
Lymphs Abs: 2.2 10*3/uL (ref 0.7–4.0)
MCH: 28 pg (ref 26.0–34.0)
MCHC: 31.9 g/dL (ref 30.0–36.0)
MCV: 87.6 fL (ref 80.0–100.0)
Monocytes Absolute: 1.1 10*3/uL — ABNORMAL HIGH (ref 0.1–1.0)
Monocytes Relative: 7 %
Neutro Abs: 12.1 10*3/uL — ABNORMAL HIGH (ref 1.7–7.7)
Neutrophils Relative %: 77 %
Platelets: 260 10*3/uL (ref 150–400)
RBC: 4.93 MIL/uL (ref 4.22–5.81)
RDW: 11.9 % (ref 11.5–15.5)
WBC: 15.6 10*3/uL — ABNORMAL HIGH (ref 4.0–10.5)
nRBC: 0 % (ref 0.0–0.2)

## 2020-02-08 MED ORDER — OXYCODONE-ACETAMINOPHEN 5-325 MG PO TABS
1.0000 | ORAL_TABLET | ORAL | Status: DC | PRN
  Administered 2020-02-08: 1 via ORAL
  Filled 2020-02-08: qty 1

## 2020-02-08 NOTE — ED Notes (Signed)
NA X4.  

## 2020-02-08 NOTE — ED Triage Notes (Signed)
Pt here for R sided testicle pain onset yesterday. Seen at [facility] and dx with testicular torsion. Pt sent here yesterday for eval and lwbs. Pt states pain is increasing.

## 2020-02-09 LAB — URINALYSIS, ROUTINE W REFLEX MICROSCOPIC
Bilirubin Urine: NEGATIVE
Glucose, UA: NEGATIVE mg/dL
Hgb urine dipstick: NEGATIVE
Ketones, ur: NEGATIVE mg/dL
Nitrite: NEGATIVE
Protein, ur: 30 mg/dL — AB
Specific Gravity, Urine: 1.021 (ref 1.005–1.030)
WBC, UA: >50 WBC/hpf — ABNORMAL HIGH (ref 0–5)
pH: 7 (ref 5.0–8.0)

## 2020-02-09 MED ORDER — HYDROCODONE-ACETAMINOPHEN 5-325 MG PO TABS: 1.0000 | ORAL_TABLET | Freq: Four times a day (QID) | ORAL | 0 refills | Status: AC | PRN

## 2020-02-09 MED ORDER — DOXYCYCLINE HYCLATE 100 MG PO CAPS: 100.0000 mg | ORAL_CAPSULE | Freq: Two times a day (BID) | ORAL | 0 refills | Status: DC

## 2020-02-09 MED ORDER — STERILE WATER FOR INJECTION IJ SOLN
INTRAMUSCULAR | Status: AC
  Administered 2020-02-09: 1 mL
  Filled 2020-02-09: qty 10

## 2020-02-09 MED ORDER — DOXYCYCLINE HYCLATE 100 MG PO TABS
100.0000 mg | ORAL_TABLET | Freq: Once | ORAL | Status: AC
  Administered 2020-02-09: 100 mg via ORAL
  Filled 2020-02-09: qty 1

## 2020-02-09 MED ORDER — KETOROLAC TROMETHAMINE 30 MG/ML IJ SOLN
30.0000 mg | Freq: Once | INTRAMUSCULAR | Status: AC
  Administered 2020-02-09: 30 mg via INTRAMUSCULAR
  Filled 2020-02-09: qty 1

## 2020-02-09 MED ORDER — CEFTRIAXONE SODIUM 500 MG IJ SOLR
500.0000 mg | Freq: Once | INTRAMUSCULAR | Status: AC
  Administered 2020-02-09: 500 mg via INTRAMUSCULAR
  Filled 2020-02-09: qty 500

## 2020-02-09 MED ORDER — IBUPROFEN 800 MG PO TABS: 800.0000 mg | ORAL_TABLET | Freq: Three times a day (TID) | ORAL | 0 refills | Status: AC | PRN

## 2020-02-09 NOTE — ED Provider Notes (Signed)
[facility] EMERGENCY DEPARTMENT Provider Note  CSN: [id number] Arrival date & time: 02/08/20 1321    History Chief Complaint  Patient presents with   Testicle Pain    HPI  Chris Kelley is a 39 y.o. male with no significant PMH reports 2-3 days ago he was having trouble urinating, felt like he could not get his urine to come out and that it was burning. He strained and eventually passed a small clot and then his urine began flowing. He interpreted this as a kidney stone but no formal diagnosis of kidney stones. After this happened he began to have increased pain and swelling to his R testicle. He was seen at [facility] and subsequently sent to the ED for evaluation of testicular torsion. He had an [place] while waiting to be seen that showed epididymitis and hydrocele, but no torsion. He left without being seen last night but returned today due to continued pain. Denies fever. He is sexually active. Unsure of STI exposure.    Past Medical History:  Diagnosis Date   Kidney stones     Past Surgical History:  Procedure Laterality Date   FOOT SURGERY      No family history on file.  Social History   Tobacco Use   Smoking status: Current Every Day Smoker    Packs/day: 0.50    Types: Cigarettes   Smokeless tobacco: Never Used  Substance Use Topics   Alcohol use: No   Drug use: Never     Home Medications Prior to Admission medications   Medication Sig Start Date End Date Taking? Authorizing Provider  doxycycline (VIBRAMYCIN) 100 MG capsule Take 1 capsule (100 mg total) by mouth 2 (two) times daily. 02/09/20   Pollyann Savoy, MD  HYDROcodone-acetaminophen (NORCO/VICODIN) 5-325 MG tablet Take 1 tablet by mouth every 6 (six) hours as needed for severe pain. 02/09/20   Pollyann Savoy, MD  ibuprofen (ADVIL) 800 MG tablet Take 1 tablet (800 mg total) by mouth every 8 (eight) hours as needed. 02/09/20   Pollyann Savoy, MD     Allergies    Patient has no known  allergies.   Review of Systems   Review of Systems A comprehensive review of systems was completed and negative except as noted in HPI.    Physical Exam BP 130/84    Pulse 96    Temp 98.4 F (36.9 C) (Oral)    Resp (!) 24    Ht 5\' 4"  (1.626 m)    Wt 61.2 kg    SpO2 96%    BMI 23.17 kg/m   Physical Exam Vitals and nursing note reviewed.  Constitutional:      Appearance: Normal appearance.  HENT:     Head: Normocephalic and atraumatic.     Nose: Nose normal.     Mouth/Throat:     Mouth: Mucous membranes are moist.  Eyes:     Extraocular Movements: Extraocular movements intact.     Conjunctiva/sclera: Conjunctivae normal.  Cardiovascular:     Rate and Rhythm: Normal rate.  Pulmonary:     Effort: Pulmonary effort is normal.     Breath sounds: Normal breath sounds.  Abdominal:     General: Abdomen is flat.     Palpations: Abdomen is soft.     Tenderness: There is no abdominal tenderness.  Genitourinary:    Penis: Normal.      Comments: No urethral discharge, no penile sores. R hemiscrotum is markedly swollen, erythematous and tender Musculoskeletal:  General: No swelling. Normal range of motion.     Cervical back: Neck supple.  Skin:    General: Skin is warm and dry.  Neurological:     General: No focal deficit present.     Mental Status: He is alert.  Psychiatric:        Mood and Affect: Mood normal.      ED Results / Procedures / Treatments   Labs (all labs ordered are listed, but only abnormal results are displayed) Labs Reviewed  CBC WITH DIFFERENTIAL/PLATELET - Abnormal; Notable for the following components:      Result Value   WBC 15.6 (*)    Neutro Abs 12.1 (*)    Monocytes Absolute 1.1 (*)    Abs Immature Granulocytes 0.08 (*)    All other components within normal limits  URINALYSIS, ROUTINE W REFLEX MICROSCOPIC - Abnormal; Notable for the following components:   APPearance CLOUDY (*)    Protein, ur 30 (*)    Leukocytes,Ua LARGE (*)    WBC,  UA >50 (*)    Bacteria, UA RARE (*)    All other components within normal limits  URINE CULTURE  GC/CHLAMYDIA PROBE AMP (Media) NOT AT [facility]    EKG None  Radiology US SCROTUM W/DOPPLER  Result Date: 02/08/2020 CLINICAL DATA:  Right-sided testicular pain for 1 day EXAM: SCROTAL ULTRASOUND DOPPLER ULTRASOUND OF THE TESTICLES TECHNIQUE: Complete ultrasound examination of the testicles, epididymis, and other scrotal structures was performed. Color and spectral Doppler ultrasound were also utilized to evaluate blood flow to the testicles. COMPARISON:  None. FINDINGS: Right testicle Measurements: 5.3 x 3.1 x 4.0 cm. No mass or microlithiasis visualized. Left testicle Measurements: 4.9 x 3.3 x 3.0 cm. No mass or microlithiasis visualized. Right epididymis: Increased vascularity is noted within the right epididymis likely related to epididymitis. Left epididymis:  Normal in size and appearance. Hydrocele: Small right hydrocele is noted with complicated septations. Varicocele:  None visualized. Pulsed Doppler interrogation of both testes demonstrates normal low resistance arterial and venous waveforms bilaterally. IMPRESSION: Increased vascularity within the right epididymis likely related to epididymitis. Mildly complicated right hydrocele. Normal-appearing testicles bilaterally. Electronically Signed   By: Alcide Clever M.D.   On: 02/08/2020 15:18   US SCROTUM W/DOPPLER  Result Date: 02/07/2020 CLINICAL DATA:  Right testicular pain, suspected torsion. EXAM: SCROTAL ULTRASOUND DOPPLER ULTRASOUND OF THE TESTICLES TECHNIQUE: Complete ultrasound examination of the testicles, epididymis, and other scrotal structures was performed. Color and spectral Doppler ultrasound were also utilized to evaluate blood flow to the testicles. COMPARISON:  None. FINDINGS: Right testicle Measurements: 5.2 x 3 x 3.9 cm. No mass or microlithiasis visualized. Left testicle Measurements: 5.3 x 2.8 x 3.5 Cm. No mass or  microlithiasis visualized. Right epididymis:  Enlarged and hypervascular. Left epididymis:  Normal in size and appearance. Hydrocele: Large multi septated right hydrocele. Small simple left hydrocele. Varicocele:  None visualized. Pulsed Doppler interrogation of both testes demonstrates normal low resistance arterial and venous waveforms bilaterally. No increased vascularity to the right testis. Per ultrasound tech on physical exam the right knee and right testicle is red and swollen. IMPRESSION: 1. Right epididymitis with associated complex large right hydrocele which could represent a pyocele. 2. No findings to suggest testicular torsion; however, please note torsion could be intermittent and not visualized. 3. Remainder of the scrotal ultrasound is unremarkable. These results were called by telephone at the time of interpretation on 02/07/2020 at 10:35 pm to provider DAN FLOYD , who verbally acknowledged these  results. Electronically Signed   By: Tish Frederickson M.D.   On: 02/07/2020 22:36    Procedures Procedures  Medications Ordered in the ED Medications  oxyCODONE-acetaminophen (PERCOCET/ROXICET) 5-325 MG per tablet 1 tablet (1 tablet Oral Given 02/08/20 2118)  cefTRIAXone (ROCEPHIN) injection 500 mg (has no administration in time range)  doxycycline (VIBRA-TABS) tablet 100 mg (has no administration in time range)  ketorolac (TORADOL) 30 MG/ML injection 30 mg (has no administration in time range)  sterile water (preservative free) injection (has no administration in time range)     MDM Rules/Calculators/A&P MDM [place] today re-demonstrates epididymitis and hydrocele. No signs of torsion. Given concern for possible STI, will treat with Rocephin, Doxycycline. UA and GC/CT ordered. Plan discharge with Urology follow up.   ED Course  I have reviewed the triage vital signs and the nursing notes.  Pertinent labs & imaging results that were available during my care of the patient were reviewed by  me and considered in my medical decision making (see chart for details).  Clinical Course as of 02/09/20 0125  Sat Feb 09, 2020  0024 CBC shows leukocytosis. UA and GC/CT not yet collected.  [CS]  0122 UA with signs of infection, consistent with epididymitis. Plan discharge as above, Urine culture added. GC/CT still pending.   [CS]    Clinical Course User Index [CS] Pollyann Savoy, MD    Final Clinical Impression(s) / ED Diagnoses Final diagnoses:  Epididymitis    Rx / DC Orders ED Discharge Orders         Ordered    doxycycline (VIBRAMYCIN) 100 MG capsule  2 times daily        02/09/20 0124    ibuprofen (ADVIL) 800 MG tablet  Every 8 hours PRN        02/09/20 0124    HYDROcodone-acetaminophen (NORCO/VICODIN) 5-325 MG tablet  Every 6 hours PRN        02/09/20 0124           Pollyann Savoy, MD 02/09/20 [phone]

## 2020-02-10 LAB — URINE CULTURE

## 2021-09-26 ENCOUNTER — Other Ambulatory Visit: Payer: Self-pay

## 2021-09-26 ENCOUNTER — Encounter (HOSPITAL_COMMUNITY): Payer: Self-pay | Admitting: Emergency Medicine

## 2021-09-26 ENCOUNTER — Emergency Department (HOSPITAL_COMMUNITY)
Admission: EM | Admit: 2021-09-26 | Discharge: 2021-09-27 | Disposition: A | Discharge status: Home Health | Payer: Self-pay | Attending: Emergency Medicine | Admitting: Emergency Medicine

## 2021-09-26 DIAGNOSIS — Z87442 Personal history of urinary calculi: Secondary | ICD-10-CM | POA: Insufficient documentation

## 2021-09-26 DIAGNOSIS — L089 Local infection of the skin and subcutaneous tissue, unspecified: Secondary | ICD-10-CM

## 2021-09-26 DIAGNOSIS — D72829 Elevated white blood cell count, unspecified: Secondary | ICD-10-CM | POA: Insufficient documentation

## 2021-09-26 DIAGNOSIS — L03116 Cellulitis of left lower limb: Secondary | ICD-10-CM

## 2021-09-26 LAB — CBC WITH DIFFERENTIAL/PLATELET
Abs Immature Granulocytes: 0.07 10*3/uL (ref 0.00–0.07)
Basophils Absolute: 0 10*3/uL (ref 0.0–0.1)
Basophils Relative: 0 %
Eosinophils Absolute: 0.1 10*3/uL (ref 0.0–0.5)
Eosinophils Relative: 1 %
HCT: 40.6 % (ref 39.0–52.0)
Hemoglobin: 13.5 g/dL (ref 13.0–17.0)
Immature Granulocytes: 1 %
Lymphocytes Relative: 22 %
Lymphs Abs: 2.5 10*3/uL (ref 0.7–4.0)
MCH: 29.3 pg (ref 26.0–34.0)
MCHC: 33.3 g/dL (ref 30.0–36.0)
MCV: 88.1 fL (ref 80.0–100.0)
Monocytes Absolute: 0.6 10*3/uL (ref 0.1–1.0)
Monocytes Relative: 5 %
Neutro Abs: 8.1 10*3/uL — ABNORMAL HIGH (ref 1.7–7.7)
Neutrophils Relative %: 71 %
Platelets: 237 10*3/uL (ref 150–400)
RBC: 4.61 MIL/uL (ref 4.22–5.81)
RDW: 12.3 % (ref 11.5–15.5)
WBC: 11.4 10*3/uL — ABNORMAL HIGH (ref 4.0–10.5)
nRBC: 0 % (ref 0.0–0.2)

## 2021-09-26 LAB — COMPREHENSIVE METABOLIC PANEL
ALT: 59 U/L — ABNORMAL HIGH (ref 0–44)
AST: 31 U/L (ref 15–41)
Albumin: 3.5 g/dL (ref 3.5–5.0)
Alkaline Phosphatase: 114 U/L (ref 38–126)
Anion gap: 8 (ref 5–15)
BUN: 13 mg/dL (ref 6–20)
CO2: 27 mmol/L (ref 22–32)
Calcium: 9.1 mg/dL (ref 8.9–10.3)
Chloride: 103 mmol/L (ref 98–111)
Creatinine, Ser: 0.93 mg/dL (ref 0.61–1.24)
GFR, Estimated: >60 mL/min (ref >60)
Glucose, Bld: 138 mg/dL — ABNORMAL HIGH (ref 70–99)
Potassium: 4 mmol/L (ref 3.5–5.1)
Sodium: 138 mmol/L (ref 135–145)
Total Bilirubin: 0.6 mg/dL (ref 0.3–1.2)
Total Protein: 6.9 g/dL (ref 6.5–8.1)

## 2021-09-26 LAB — LACTIC ACID, PLASMA: Lactic Acid, Venous: 2.6 mmol/L (ref 0.5–1.9)

## 2021-09-26 NOTE — ED Triage Notes (Signed)
Pt reports having what he initially thought was poison ivy and then he developed abscesses to bilateral shins.  Pt has erythematous and hot to touch swelling to both lower legs, left worse than right. Fever and chills at home.

## 2021-09-27 ENCOUNTER — Encounter (HOSPITAL_COMMUNITY): Payer: Self-pay | Admitting: Emergency Medicine

## 2021-09-27 LAB — LACTIC ACID, PLASMA: Lactic Acid, Venous: 2 mmol/L (ref 0.5–1.9)

## 2021-09-27 MED ORDER — SULFAMETHOXAZOLE-TRIMETHOPRIM 800-160 MG PO TABS
1.0000 | ORAL_TABLET | Freq: Once | ORAL | Status: AC
  Administered 2021-09-27: 1 via ORAL
  Filled 2021-09-27: qty 1

## 2021-09-27 MED ORDER — SULFAMETHOXAZOLE-TRIMETHOPRIM 800-160 MG PO TABS: 1.0000 | ORAL_TABLET | Freq: Two times a day (BID) | ORAL | 0 refills | Status: AC

## 2021-09-27 NOTE — ED Provider Notes (Signed)
[facility] EMERGENCY DEPARTMENT Provider Note  CSN: [id number] Arrival date & time: 09/26/21 2150  Chief Complaint(s) Wound Infection  HPI Chris Kelley is a 41 y.o. male  here for leg infection.  Patient reports having had poison ivy rash on his lower extremities.  5 days ago he scratched his left leg.  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  Since then he developed expanding redness with central fluctuance that has been draining for the past 2 days.  Patient has applied potato and aloe on top of it.  Reports that he had spare penicillin which she took for the past 2 days without improvement.  He is endorsing increased pain and redness.  He endorses subjective fevers and chills at home.  Denies any other physical complaints.  Denies any IV drug use.  The history is provided by the patient.    Past Medical History Past Medical History:  Diagnosis Date   Kidney stones    There are no problems to display for this patient.  Home Medication(s) Prior to Admission medications   Medication Sig Start Date End Date Taking? Authorizing Provider  sulfamethoxazole-trimethoprim (BACTRIM DS) 800-160 MG tablet Take 1 tablet by mouth 2 (two) times daily for 7 days. 09/27/21 10/04/21 Yes Kearah Gayden, Amadeo Garnet, MD  HYDROcodone-acetaminophen (NORCO/VICODIN) 5-325 MG tablet Take 1 tablet by mouth every 6 (six) hours as needed for severe pain. 02/09/20   Pollyann Savoy, MD  ibuprofen (ADVIL) 800 MG tablet Take 1 tablet (800 mg total) by mouth every 8 (eight) hours as needed. 02/09/20   Pollyann Savoy, MD                                                                                                                                    Allergies Patient has no known allergies.  Review of Systems Review of Systems As noted in HPI  Physical Exam Vital Signs  I have reviewed the triage vital signs BP (!) 123/93   Pulse 78   Temp  98.5 F (36.9 C) (Oral)   Resp 17   SpO2 97%   Physical Exam Vitals reviewed.  Constitutional:      General: He is not in acute distress.    Appearance: He is well-developed. He is not diaphoretic.  HENT:     Head: Normocephalic and atraumatic.     Right Ear: External ear normal.     Left Ear: External ear normal.     Nose: Nose normal.     Mouth/Throat:     Mouth: Mucous membranes are moist.  Eyes:     General: No scleral icterus.    Conjunctiva/sclera: Conjunctivae normal.  Neck:     Trachea: Phonation normal.  Cardiovascular:     Rate and Rhythm: Normal rate and regular rhythm.  Pulmonary:     Effort: Pulmonary effort is normal. No  respiratory distress.     Breath sounds: No stridor.  Abdominal:     General: There is no distension.  Musculoskeletal:        General: Normal range of motion.     Cervical back: Normal range of motion.       Legs:  Neurological:     Mental Status: He is alert and oriented to person, place, and time.  Psychiatric:        Behavior: Behavior normal.      ED Results and Treatments Labs (all labs ordered are listed, but only abnormal results are displayed) Labs Reviewed  LACTIC ACID, PLASMA - Abnormal; Notable for the following components:      Result Value   Lactic Acid, Venous 2.6 (*)    All other components within normal limits  LACTIC ACID, PLASMA - Abnormal; Notable for the following components:   Lactic Acid, Venous 2.0 (*)    All other components within normal limits  COMPREHENSIVE METABOLIC PANEL - Abnormal; Notable for the following components:   Glucose, Bld 138 (*)    ALT 59 (*)    All other components within normal limits  CBC WITH DIFFERENTIAL/PLATELET - Abnormal; Notable for the following components:   WBC 11.4 (*)    Neutro Abs 8.1 (*)    All other components within normal limits                                                                                                                         EKG  EKG  Interpretation  Date/Time:    Ventricular Rate:    PR Interval:    QRS Duration:   QT Interval:    QTC Calculation:   R Axis:     Text Interpretation:         Radiology No results found.  Pertinent labs & imaging results that were available during my care of the patient were reviewed by me and considered in my medical decision making (see MDM for details).  Medications Ordered in ED Medications  sulfamethoxazole-trimethoprim (BACTRIM DS) 800-160 MG per tablet 1 tablet (1 tablet Oral Given 09/27/21 0158)                                                                                                                                     Procedures Procedures  (including critical care time)  Medical Decision Making / ED  Course    Complexity of Problem:   Patient's presenting problem/concern, DDX, and MDM listed below: Left leg cellulitis with central purulence already draining.     Complexity of Data:   Laboratory Tests ordered listed below with my independent interpretation: CBC with leukocytosis.  No anemia. Metabolic panel without significant electrolyte derangements or renal sufficiency. Initial lactic acid elevated but trending down without intervention.    ED Course:    Assessment, Add'l Intervention, and Reassessment: Left leg cellulitis with central purulence Patient does not want the wound to be I&D'd. We will treat with oral antibiotics Recommended warm compresses/soaks.    Final Clinical Impression(s) / ED Diagnoses Final diagnoses:  Wound infection  Cellulitis of left lower extremity   The patient appears reasonably screened and/or stabilized for discharge and I doubt any other medical condition or other [facility] requiring further screening, evaluation, or treatment in the ED at this time. I have discussed the findings, Dx and Tx plan with the patient/family who expressed understanding and agree(s) with the plan. Discharge instructions discussed at length.  The patient/family was given strict return precautions who verbalized understanding of the instructions. No further questions at time of discharge.  Disposition: Discharge  Condition: Good  ED Discharge Orders          Ordered    sulfamethoxazole-trimethoprim (BACTRIM DS) 800-160 MG tablet  2 times daily        09/27/21 0327            Follow Up: [facility] EMERGENCY DEPARTMENT [address] [id number] [address]c [place] [address] [phone]  in 3-5 days, if symptoms do not improve or  worsen           This chart was dictated using voice recognition software.  Despite best efforts to proofread,  errors can occur which can change the documentation meaning.    Nira Conn, MD 09/27/21 [phone]

## 2021-09-27 NOTE — ED Notes (Signed)
RN found pts belongings on desk. RN called pt and spouse, Ana twice. No answer. Belonging ( necklace) in lost and found.

## 2022-01-12 ENCOUNTER — Encounter (HOSPITAL_COMMUNITY): Payer: Self-pay

## 2022-01-12 ENCOUNTER — Emergency Department (HOSPITAL_COMMUNITY)
Admission: EM | Admit: 2022-01-12 | Discharge: 2022-01-13 | Discharge status: Against Medical Advice | Payer: Self-pay | Attending: Emergency Medicine | Admitting: Emergency Medicine

## 2022-01-12 ENCOUNTER — Other Ambulatory Visit: Payer: Self-pay

## 2022-01-12 DIAGNOSIS — Z5321 Procedure and treatment not carried out due to patient leaving prior to being seen by health care provider: Secondary | ICD-10-CM | POA: Insufficient documentation

## 2022-01-12 DIAGNOSIS — S0991XA Unspecified injury of ear, initial encounter: Secondary | ICD-10-CM | POA: Insufficient documentation

## 2022-01-12 DIAGNOSIS — X58XXXA Exposure to other specified factors, initial encounter: Secondary | ICD-10-CM | POA: Insufficient documentation

## 2022-01-12 NOTE — ED Triage Notes (Signed)
Pt states he was cleaning right ear and then ear started bleeding. Pt states he felt like something was in there. Pt denies pain.

## 2022-01-13 NOTE — ED Provider Notes (Signed)
Patient assigned treatment room but never responded. Not located in lobby.  Patient LWBS.  Did not see or evaluate patient.   Garlon Hatchet, PA-C 01/13/22 0045    Zadie Rhine, MD 01/13/22 [phone]

## 2022-01-13 NOTE — ED Notes (Signed)
Pt name called to bed room, no response. Pt not in restroom or seen outside.

## 2022-02-02 IMAGING — DX DG HAND COMPLETE 3+V*R*
1 series · 3 of 3 positions shown · non-contrast
Comparison: Wrist radiographs, 08/11/2010

CLINICAL DATA: Patient fell down 3 stairs landing on the right
hand. Complaining of pain.

EXAM:
RIGHT HAND - COMPLETE 3+ VIEW

[Series 1: hand · 0.14mm/px · 3 of 3 slices shown]
[im 1/3]
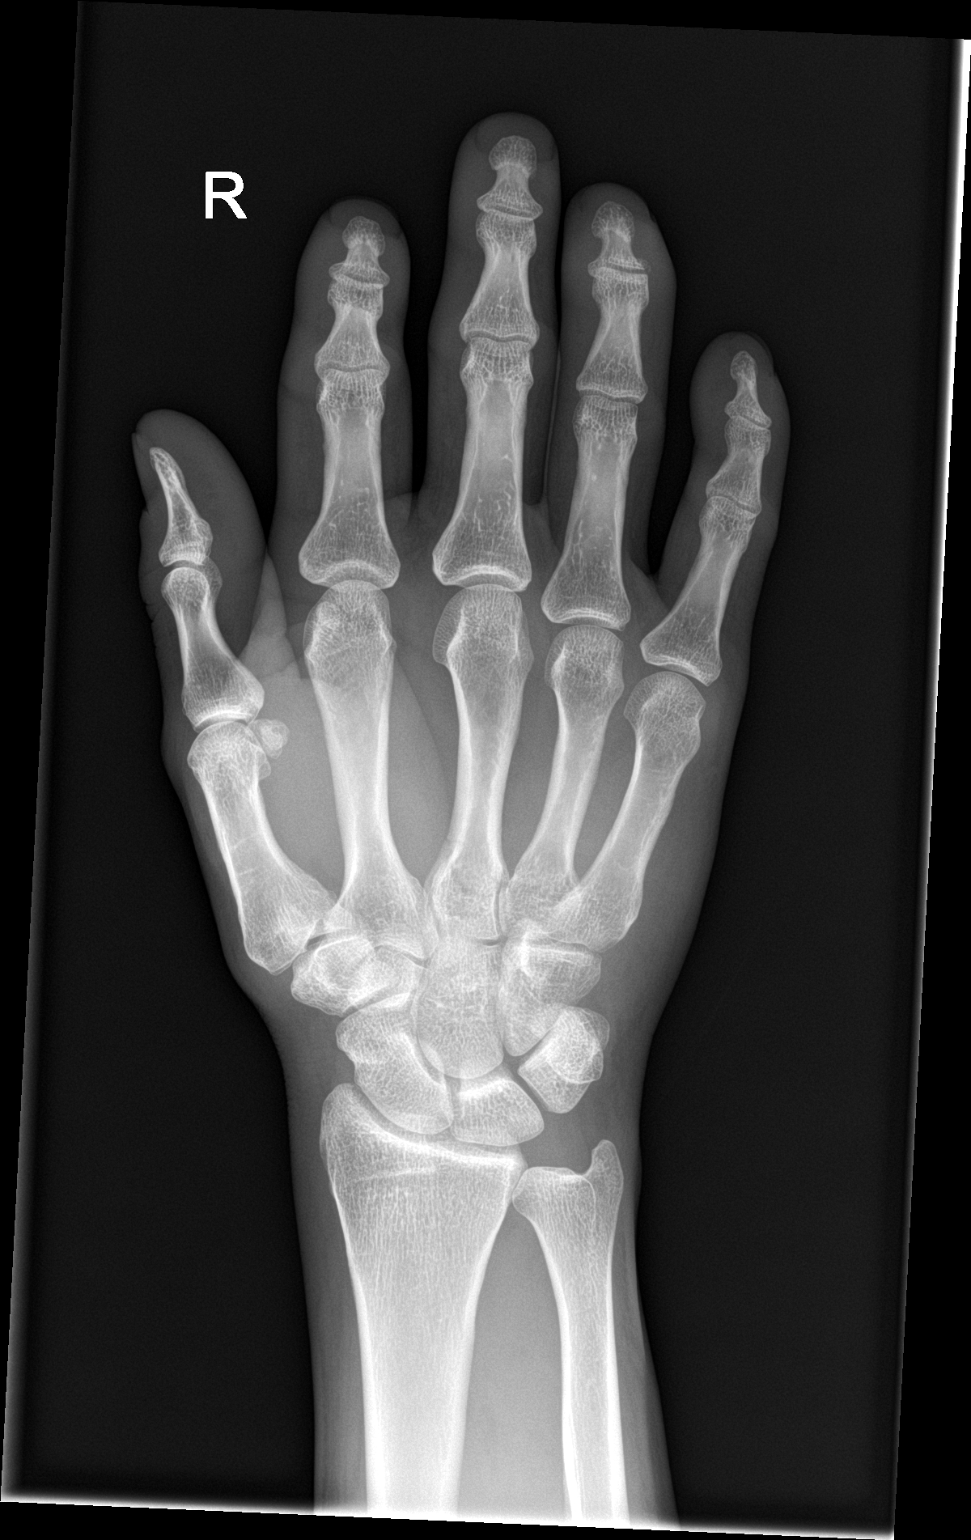
[im 2/3]
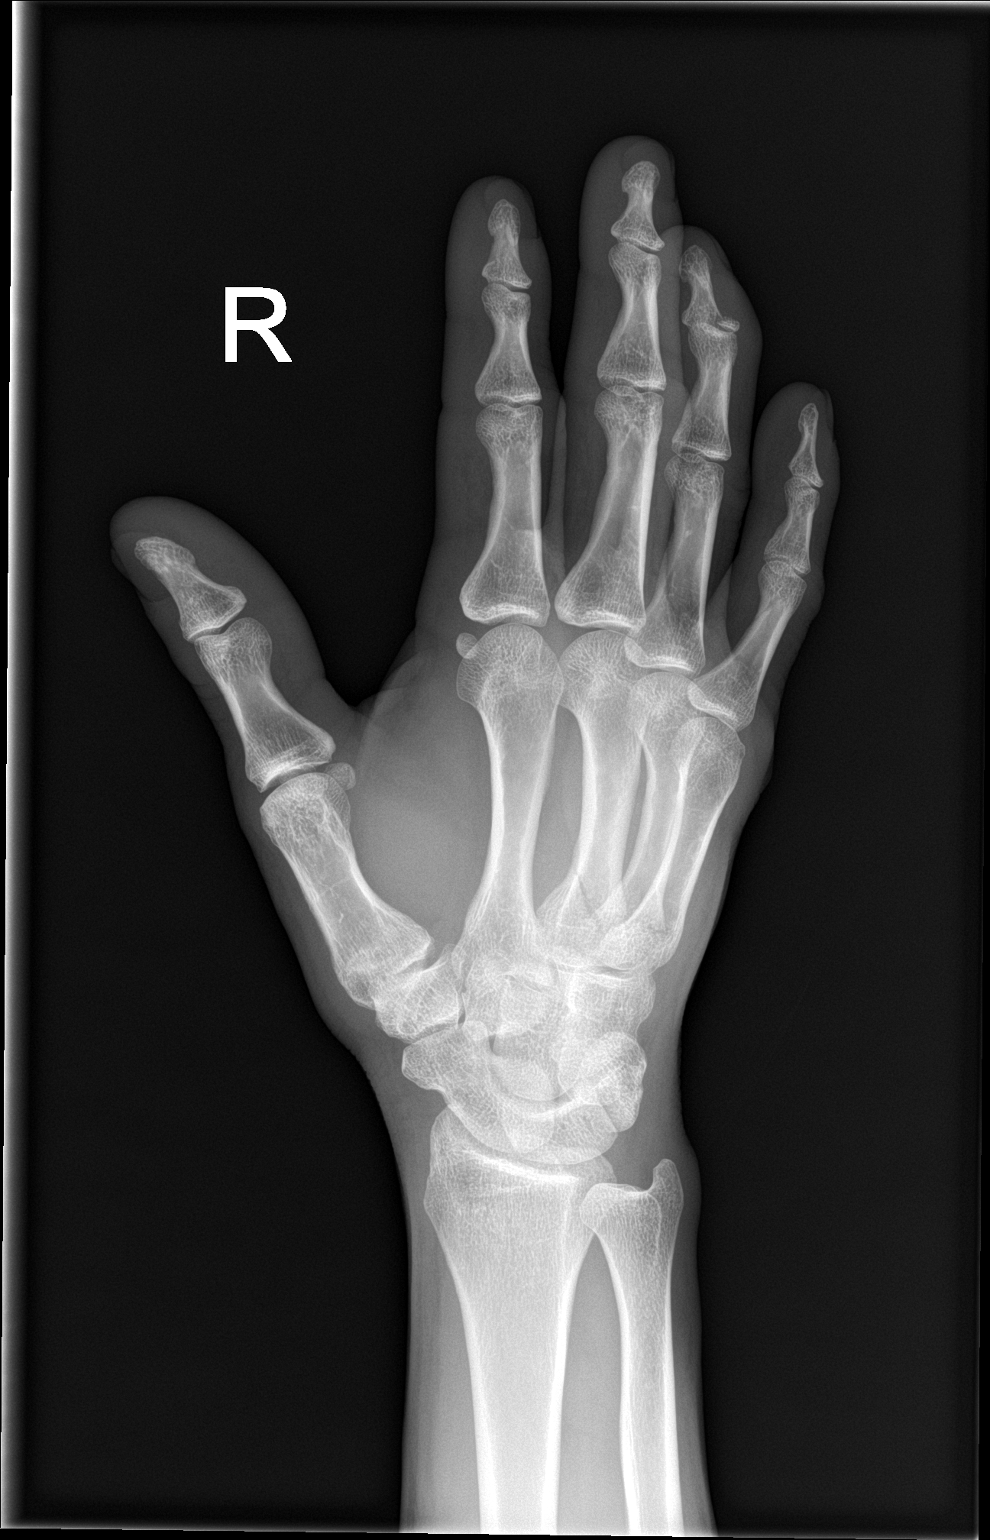
[im 3/3]
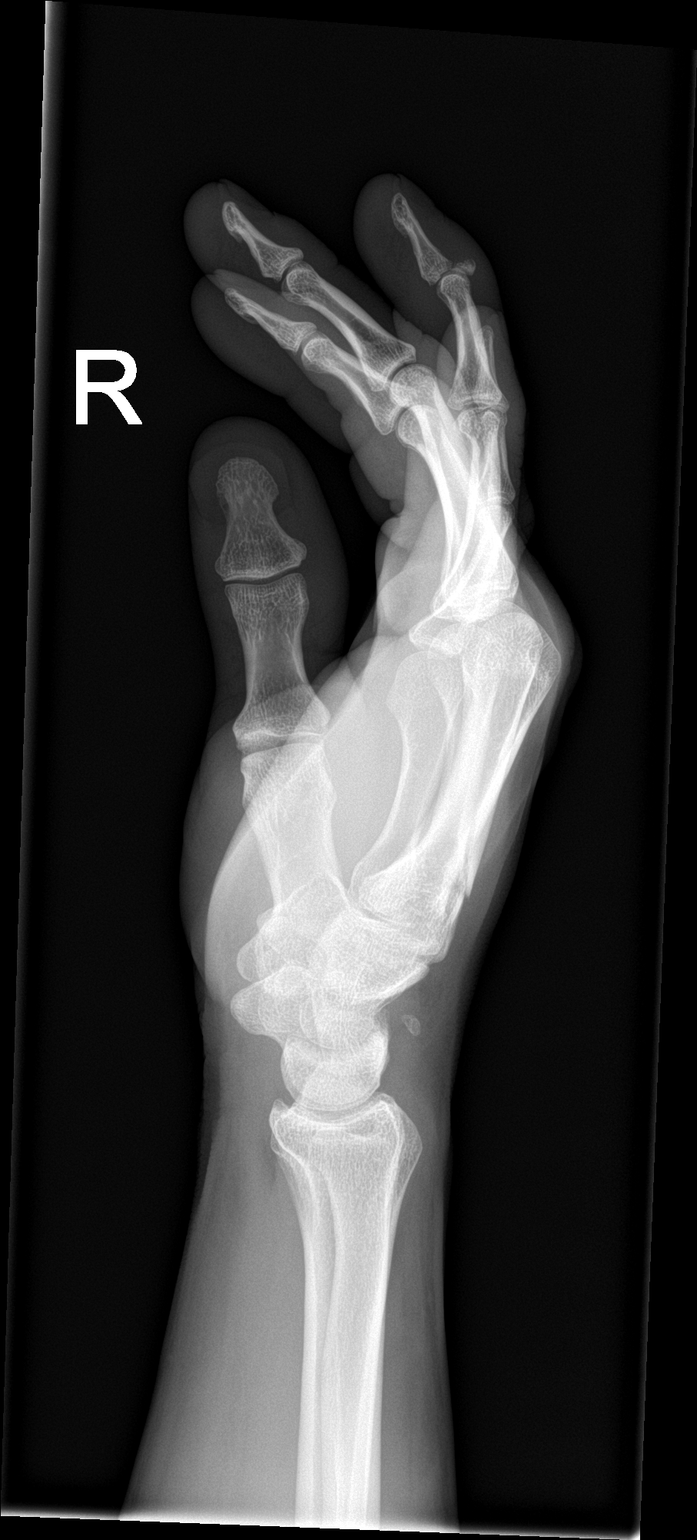

[3 of 3 positions shown; findings below may reference images not displayed]

FINDINGS: There is an oblique, non comminuted, nondisplaced fracture across
the base of the third metacarpal. There is a probable additional
fracture across the base of the fourth metacarpal, only evident on
the oblique view.

Minimally displaced dorsal avulsion fracture from the dorsal base of
the distal phalanx of the ring finger, retracted proximally by 2 mm.

Well corticated bone fragment lies dorsal to the carpal bones
consistent with an old triquetral fracture.

Mild soft tissue swelling that is most evident around the DIP joint
of the fourth finger.
IMPRESSION: 1. Oblique nondisplaced, non comminuted fracture across the base of
the third metacarpal with a probable additional transverse fracture
across the base of the fourth metacarpal.
2. Minimally displaced dorsal avulsion fracture from the dorsal base
of the distal phalanx of the fourth finger.
3. No other fractures.  No dislocation

## 2022-09-04 IMAGING — US US SCROTUM W/ DOPPLER COMPLETE
1 series · 13 of 25 positions shown · non-contrast
Comparison: None.

CLINICAL DATA: Right testicular pain, suspected torsion.

EXAM:
SCROTAL ULTRASOUND
DOPPLER ULTRASOUND OF THE TESTICLES
TECHNIQUE: Complete ultrasound examination of the testicles, epididymis, and
other scrotal structures was performed. Color and spectral Doppler
ultrasound were also utilized to evaluate blood flow to the
testicles.

[Series 1: us scrotum w/doppler · 13 of 69 slices shown]
[im 1/69]
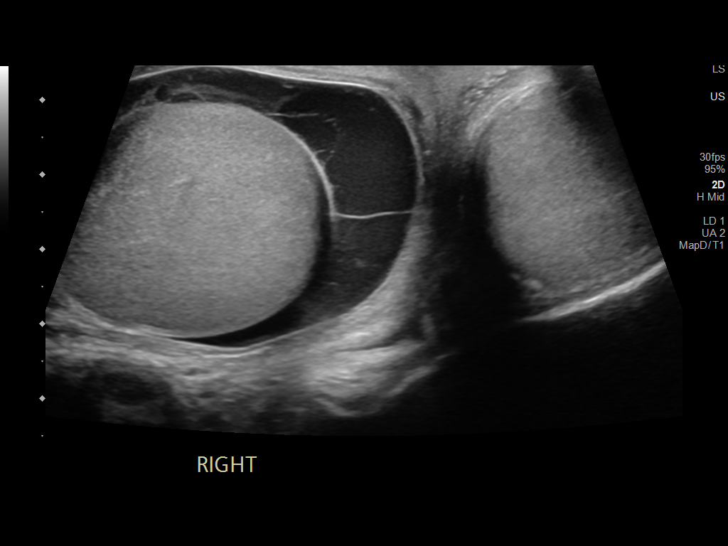
[im 6/69]
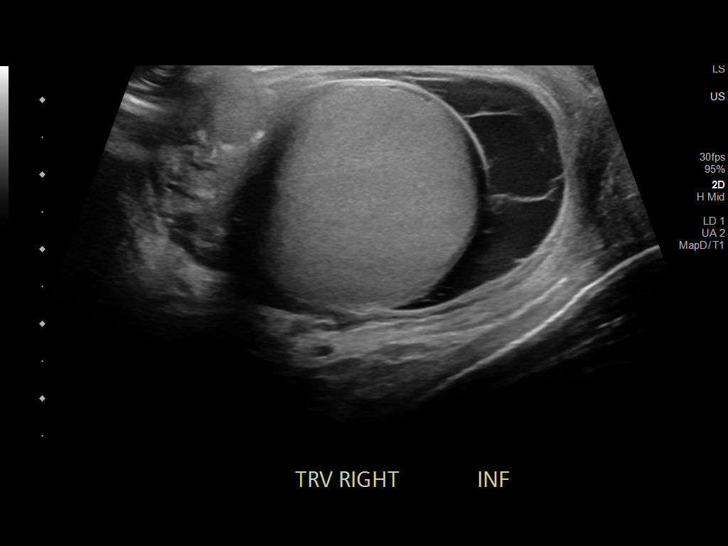
[im 12/69]
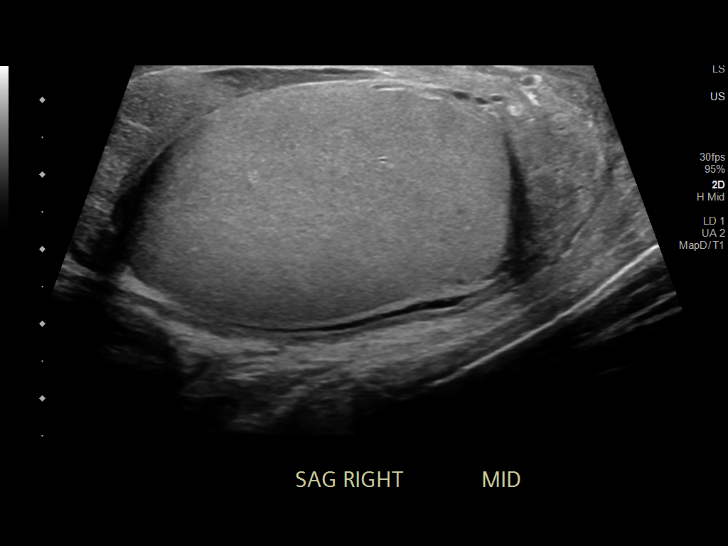
[im 18/69]
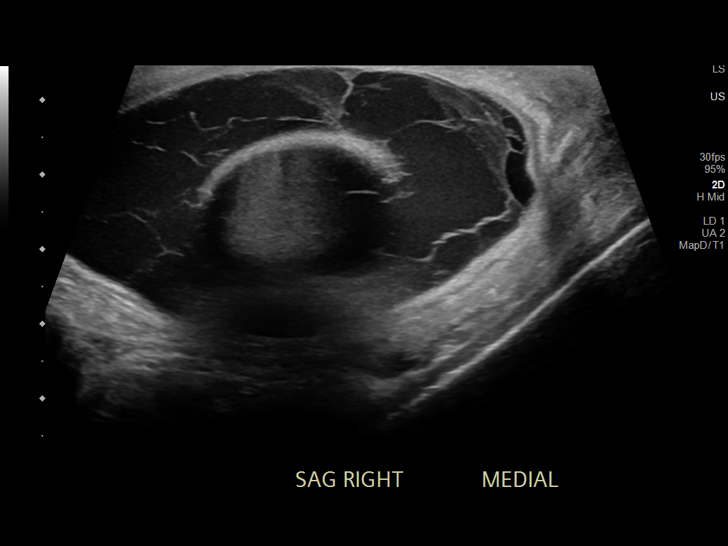
[im 23/69]
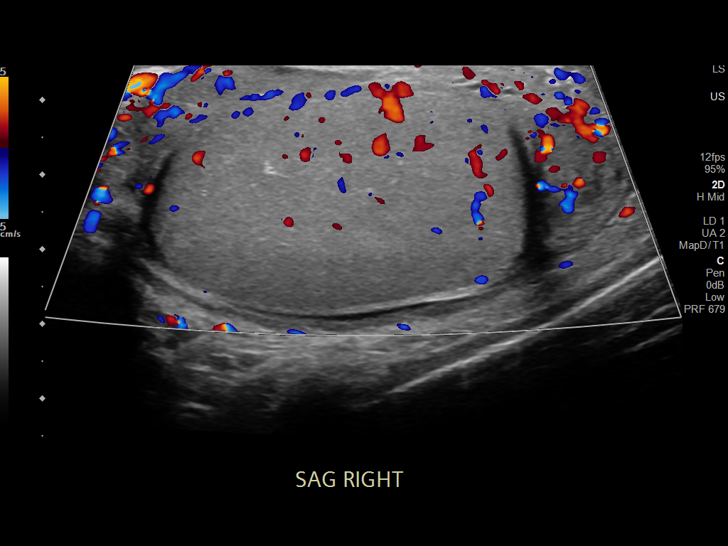
[im 29/69]
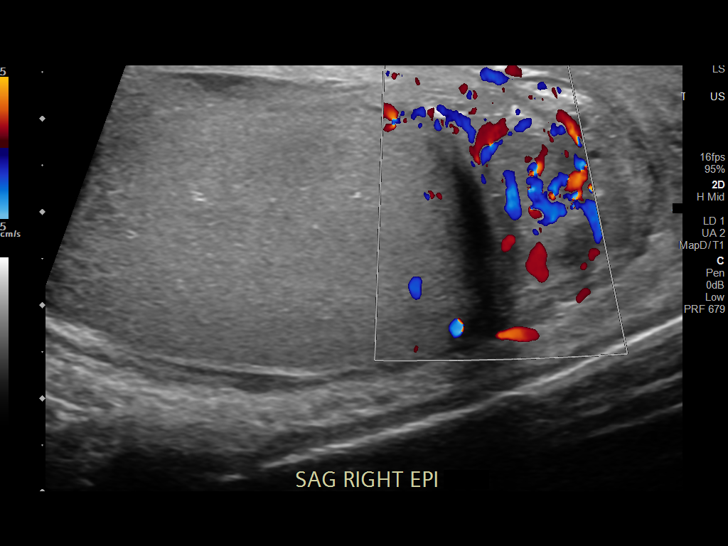
[im 35/69]
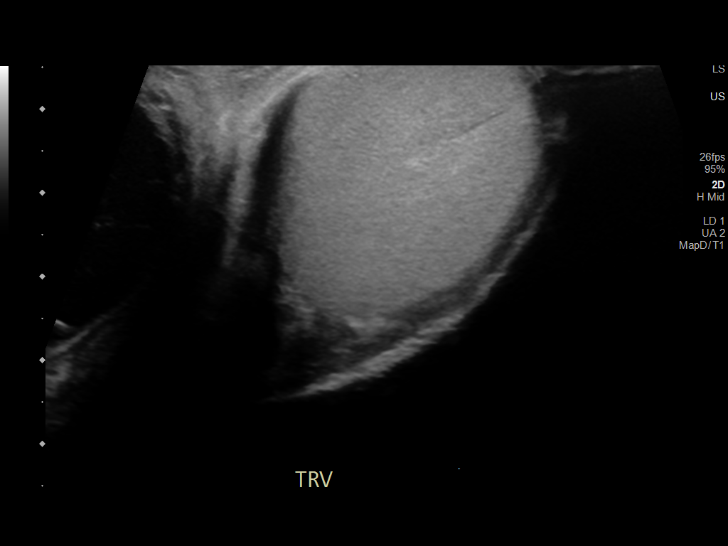
[im 40/69]
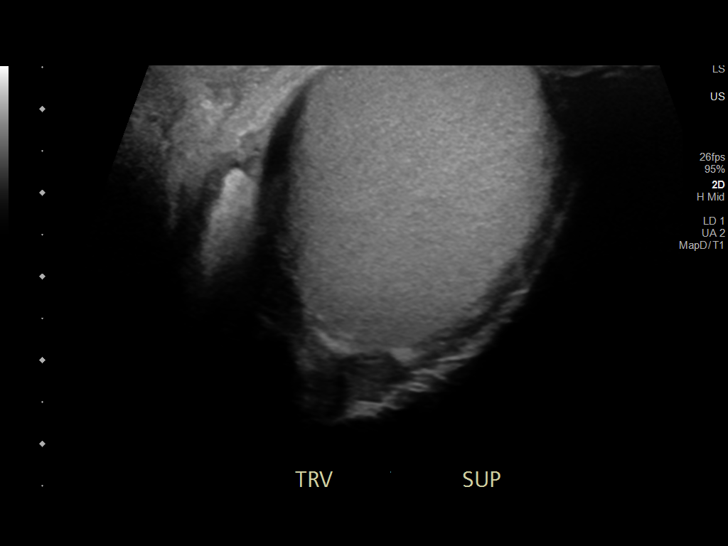
[im 46/69]
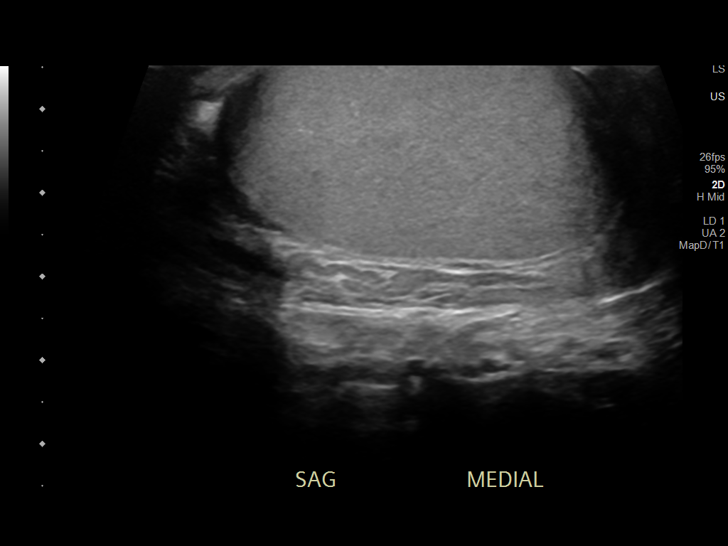
[im 52/69]
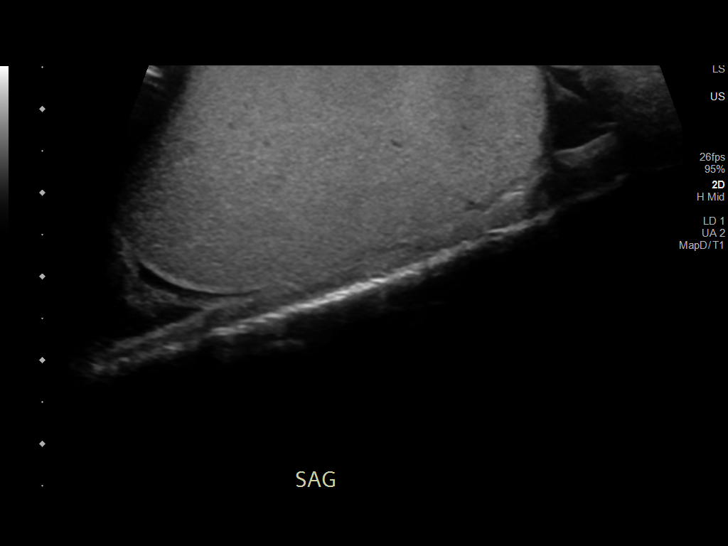
[im 57/69]
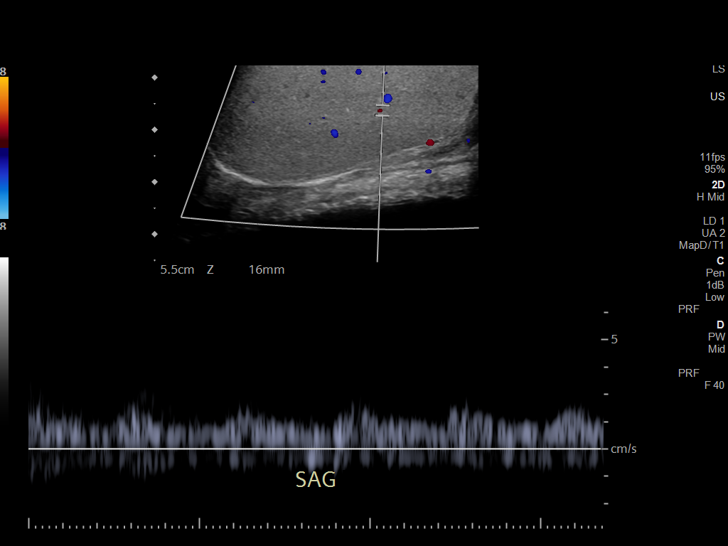
[im 63/69]
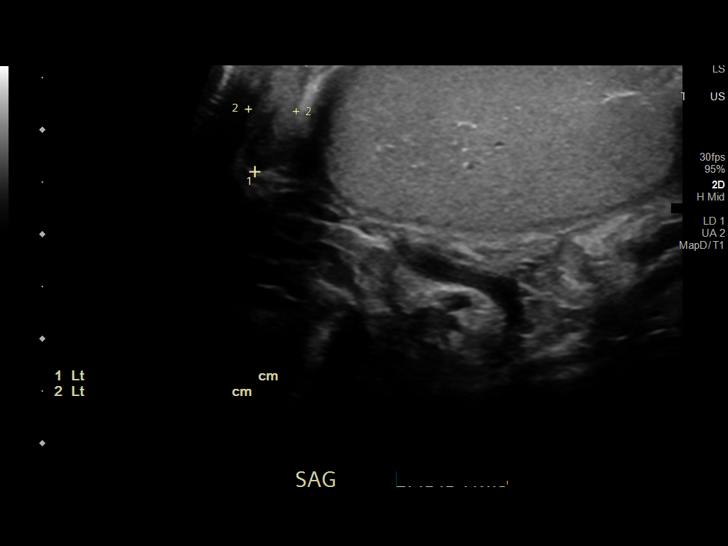
[im 69/69]
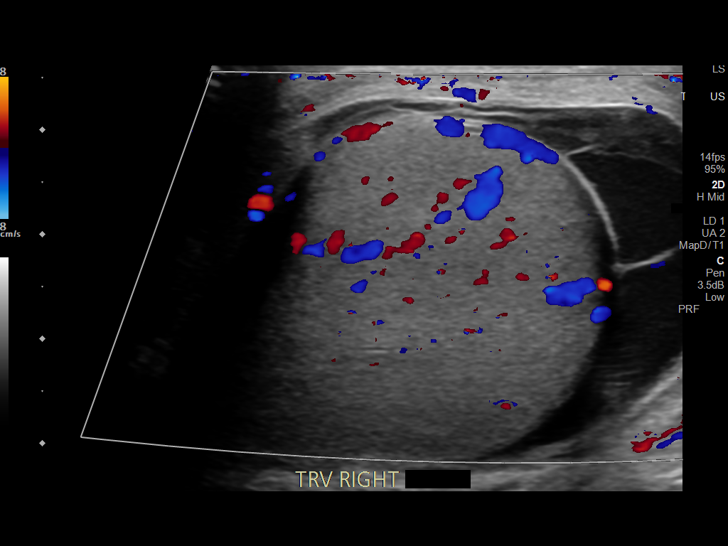

[13 of 25 positions shown; findings below may reference images not displayed]

FINDINGS: Right testicle

Measurements: 5.2 x 3 x 3.9 cm. No mass or microlithiasis
visualized.

Left testicle

Measurements: 5.3 x 2.8 x 3.5 Cm. No mass or microlithiasis
visualized.

Right epididymis:  Enlarged and hypervascular.

Left epididymis:  Normal in size and appearance.

Hydrocele: Large multi septated right hydrocele. Small simple left
hydrocele.

Varicocele:  None visualized.

Pulsed Doppler interrogation of both testes demonstrates normal low
resistance arterial and venous waveforms bilaterally. No increased
vascularity to the right testis.

Per ultrasound tech on physical exam the right knee and right
testicle is red and swollen.
IMPRESSION: 1. Right epididymitis with associated complex large right hydrocele
which could represent a pyocele.
2. No findings to suggest testicular torsion; however, please note
torsion could be intermittent and not visualized.
3. Remainder of the scrotal ultrasound is unremarkable.

These results were called by telephone at the time of interpretation
on 02/07/2020 at [DATE] to provider BUSHRA UCHE , who verbally
acknowledged these results.

## 2022-09-05 IMAGING — US US SCROTUM W/ DOPPLER COMPLETE
1 series · 14 of 25 positions shown · non-contrast
Comparison: None.

CLINICAL DATA: Right-sided testicular pain for 1 day

EXAM:
SCROTAL ULTRASOUND
DOPPLER ULTRASOUND OF THE TESTICLES
TECHNIQUE: Complete ultrasound examination of the testicles, epididymis, and
other scrotal structures was performed. Color and spectral Doppler
ultrasound were also utilized to evaluate blood flow to the
testicles.

[Series 1: us scrotum w/doppler · 67 acquisitions, 14 frames shown]
[im 1/67]
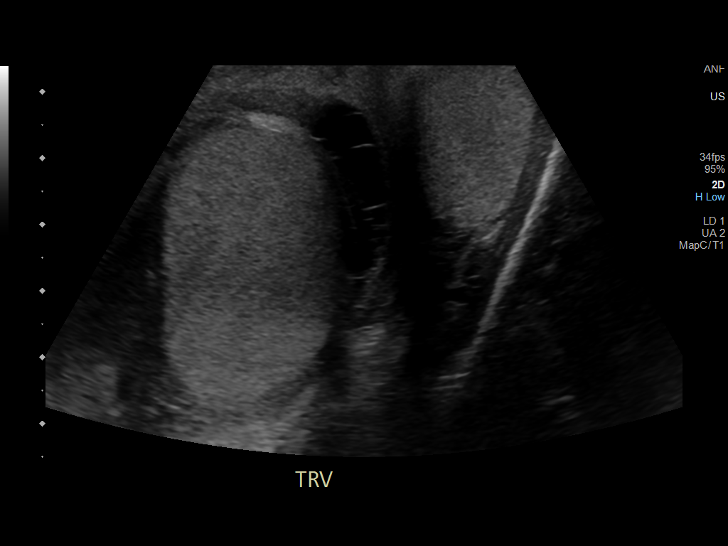
[im 6/67]
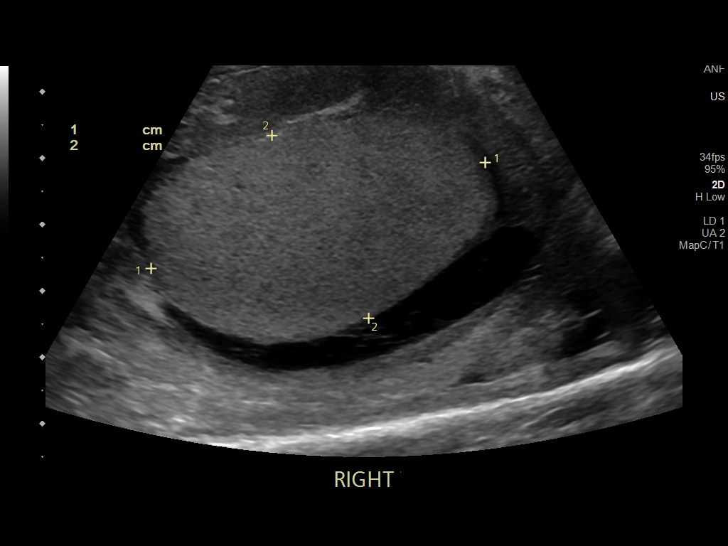
[im 12/67]
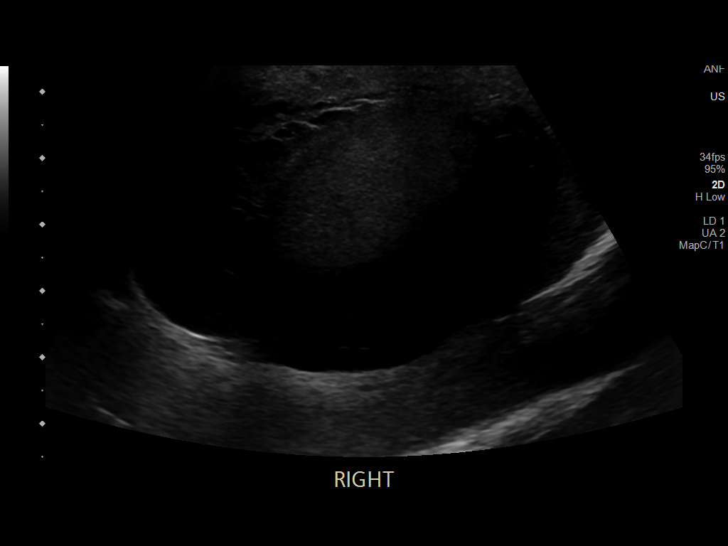
[im 17/67]
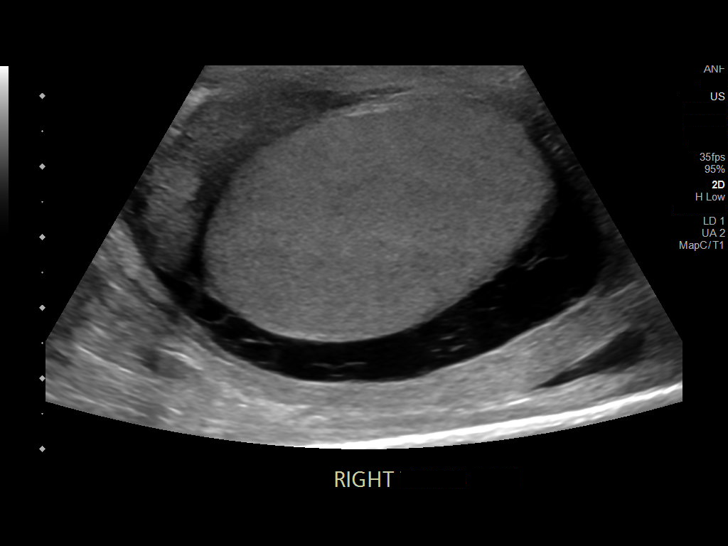
[im 23/67]
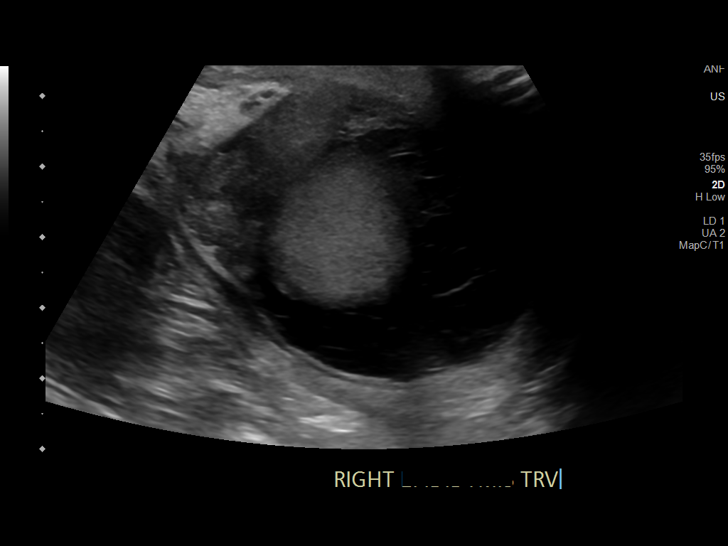
[im 25/67]
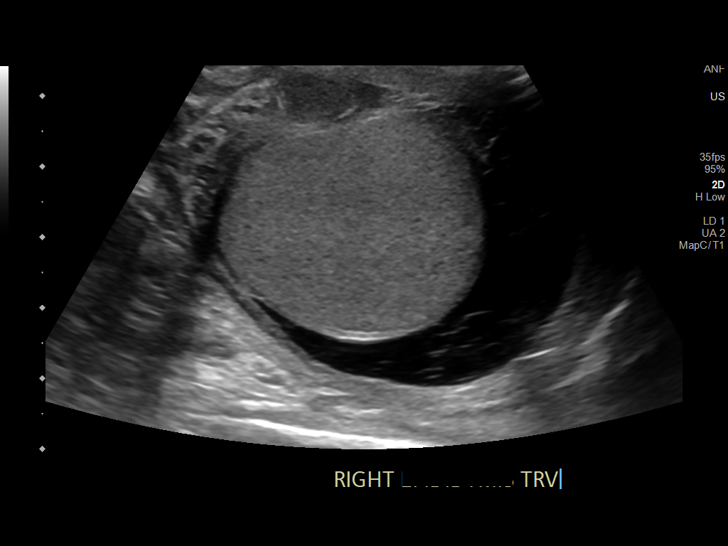
[im 31/67]
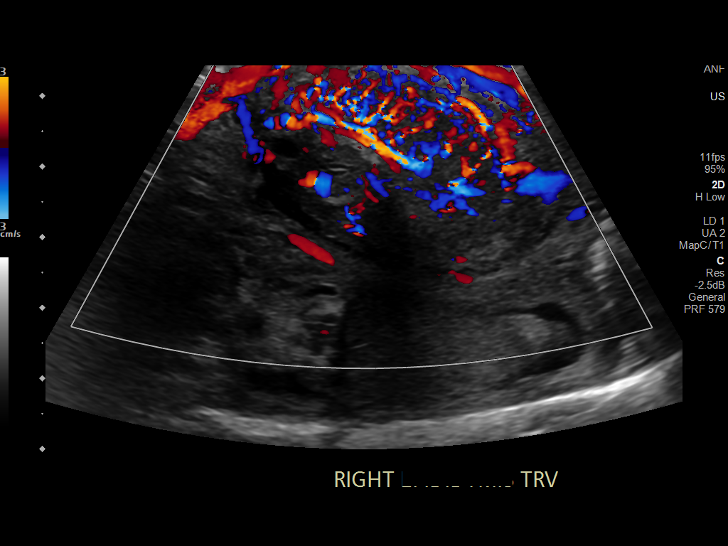
[im 36/67]
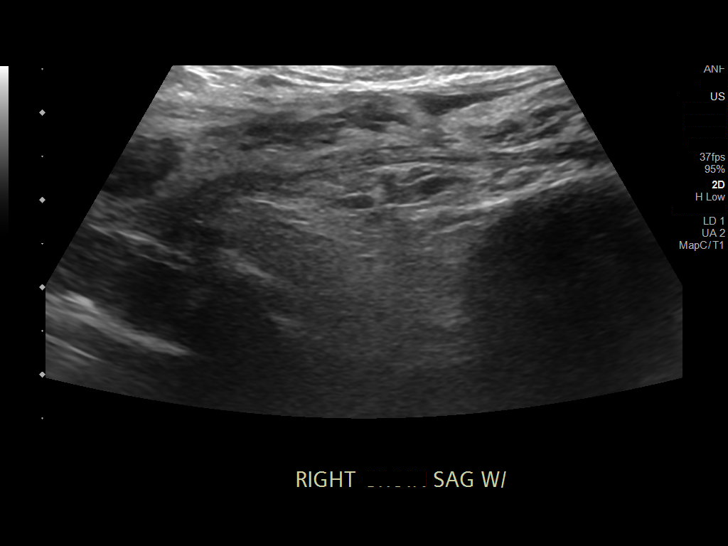
[im 42/67]
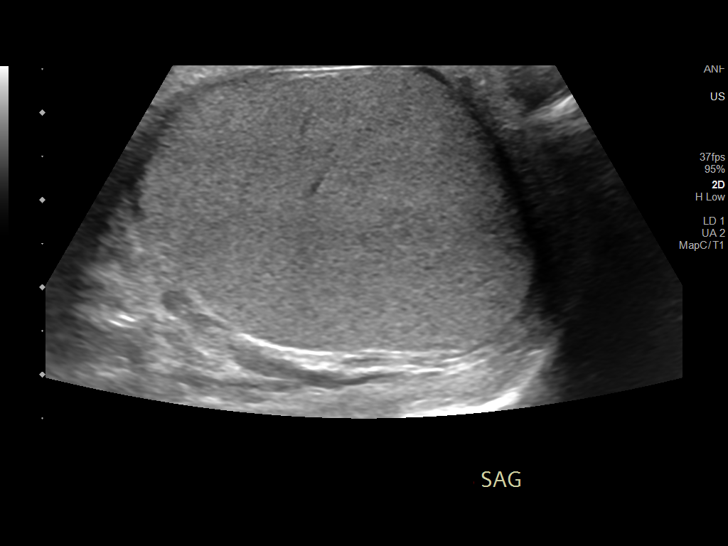
[im 45/67]
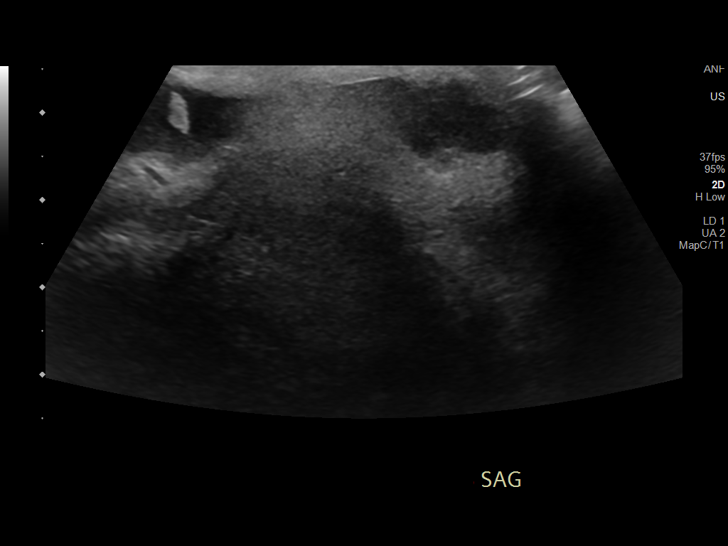
[im 50/67]
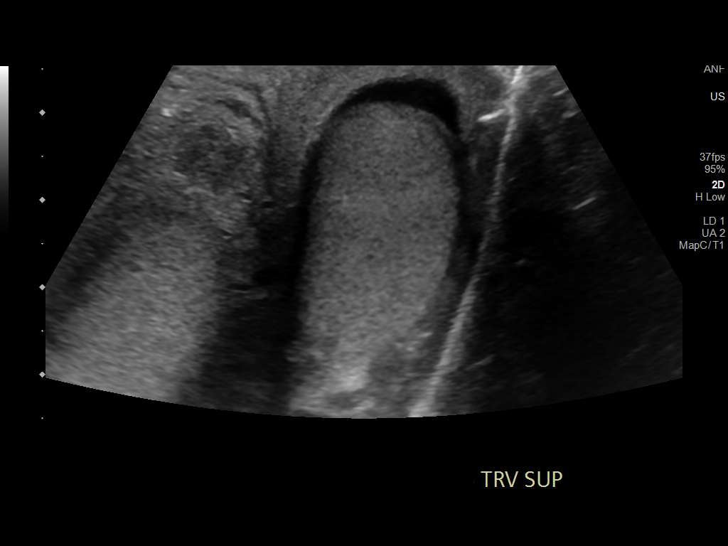
[im 56/67]
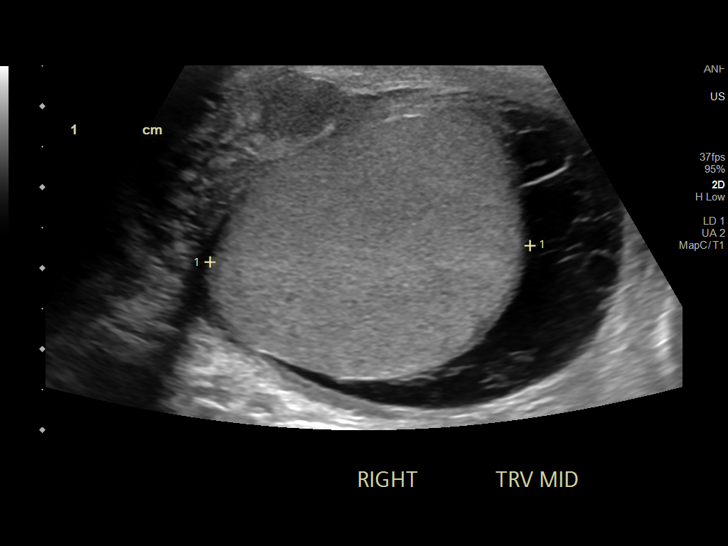
[im 61/67]
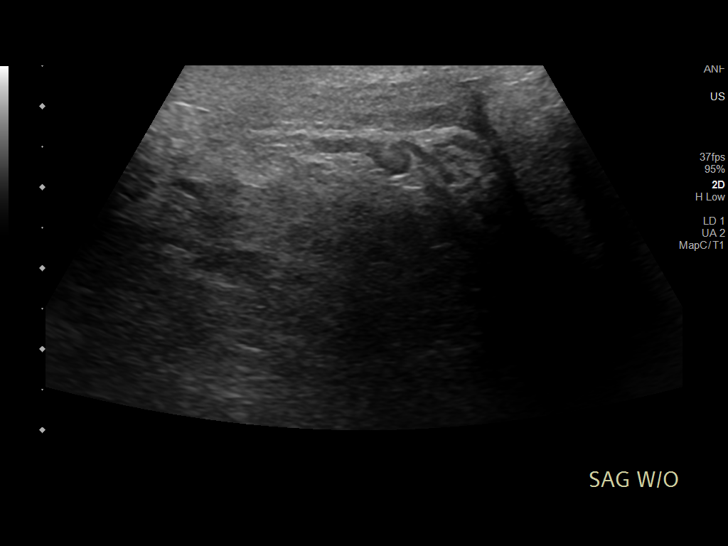
[im 67/67]
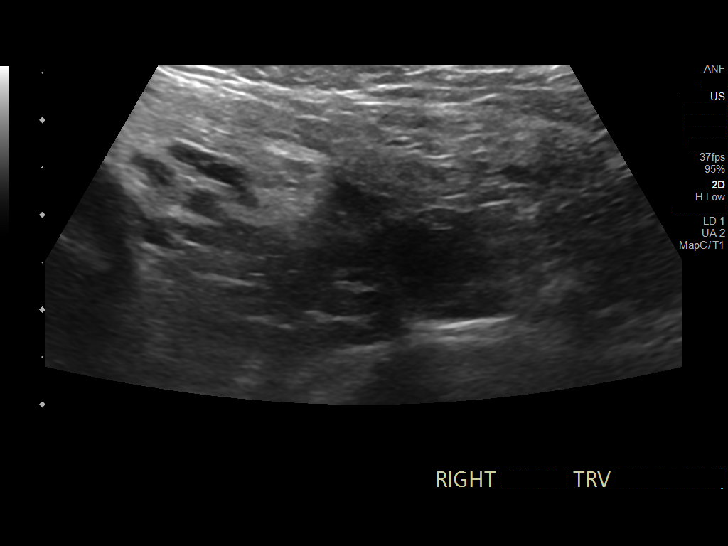

[14 of 25 positions shown; findings below may reference images not displayed]

FINDINGS: Right testicle

Measurements: 5.3 x 3.1 x 4.0 cm. No mass or microlithiasis
visualized.

Left testicle

Measurements: 4.9 x 3.3 x 3.0 cm. No mass or microlithiasis
visualized.

Right epididymis: Increased vascularity is noted within the right
epididymis likely related to epididymitis.

Left epididymis:  Normal in size and appearance.

Hydrocele: Small right hydrocele is noted with complicated
septations.

Varicocele:  None visualized.

Pulsed Doppler interrogation of both testes demonstrates normal low
resistance arterial and venous waveforms bilaterally.
IMPRESSION: Increased vascularity within the right epididymis likely related to
epididymitis.

Mildly complicated right hydrocele.

Normal-appearing testicles bilaterally.

## 2023-02-22 ENCOUNTER — Emergency Department (HOSPITAL_COMMUNITY): Payer: Self-pay

## 2023-02-22 ENCOUNTER — Emergency Department (HOSPITAL_COMMUNITY)
Admission: EM | Admit: 2023-02-22 | Discharge: 2023-02-23 | Discharge status: Against Medical Advice | Payer: Self-pay | Attending: Emergency Medicine | Admitting: Emergency Medicine

## 2023-02-22 ENCOUNTER — Encounter (HOSPITAL_COMMUNITY): Payer: Self-pay | Admitting: Emergency Medicine

## 2023-02-22 ENCOUNTER — Other Ambulatory Visit: Payer: Self-pay

## 2023-02-22 DIAGNOSIS — Z5321 Procedure and treatment not carried out due to patient leaving prior to being seen by health care provider: Secondary | ICD-10-CM | POA: Insufficient documentation

## 2023-02-22 DIAGNOSIS — M79672 Pain in left foot: Secondary | ICD-10-CM | POA: Insufficient documentation

## 2023-02-22 NOTE — ED Triage Notes (Signed)
 Pt presents after dropping a 2x4 on his left foot.  Hx of surgery on that foot with hardware.  States it is very painful.  Ambulatory to triage.

## 2023-02-23 NOTE — ED Notes (Signed)
 Pt called several times for vital signs no response.
